# Patient Record
Sex: Male | Born: 1965 | Race: White | Hispanic: No | Smoking: Current every day smoker
Health system: Southern US, Community
[De-identification: ages and names within clinical notes are randomized; demographics above are authoritative.]

## PROBLEM LIST (undated history)

## (undated) DIAGNOSIS — R7303 Prediabetes: Secondary | ICD-10-CM

## (undated) DIAGNOSIS — F419 Anxiety disorder, unspecified: Secondary | ICD-10-CM

## (undated) DIAGNOSIS — I10 Essential (primary) hypertension: Secondary | ICD-10-CM

## (undated) HISTORY — PX: NASAL SINUS SURGERY: SHX719

## (undated) HISTORY — PX: HIATAL HERNIA REPAIR: SHX195

## (undated) HISTORY — PX: CERVICAL SPINE SURGERY: SHX589

---

## 1999-11-26 ENCOUNTER — Encounter: Payer: Self-pay | Admitting: Family Medicine

## 1999-11-26 ENCOUNTER — Encounter: Admission: RE | Admit: 1999-11-26 | Discharge: 1999-11-26 | Payer: Self-pay | Admitting: Family Medicine

## 2001-02-05 ENCOUNTER — Encounter: Admission: RE | Admit: 2001-02-05 | Discharge: 2001-02-05 | Payer: Self-pay | Admitting: Family Medicine

## 2001-02-06 ENCOUNTER — Encounter: Admission: RE | Admit: 2001-02-06 | Discharge: 2001-02-06 | Payer: Self-pay | Admitting: Family Medicine

## 2001-03-31 ENCOUNTER — Encounter: Admission: RE | Admit: 2001-03-31 | Discharge: 2001-05-13 | Payer: Self-pay | Admitting: Neurosurgery

## 2003-02-07 ENCOUNTER — Ambulatory Visit (HOSPITAL_BASED_OUTPATIENT_CLINIC_OR_DEPARTMENT_OTHER): Admission: RE | Admit: 2003-02-07 | Discharge: 2003-02-07 | Payer: Self-pay | Admitting: Otolaryngology

## 2003-12-12 ENCOUNTER — Ambulatory Visit (HOSPITAL_COMMUNITY): Admission: RE | Admit: 2003-12-12 | Discharge: 2003-12-13 | Payer: Self-pay | Admitting: Neurosurgery

## 2005-12-11 ENCOUNTER — Emergency Department (HOSPITAL_COMMUNITY): Admission: EM | Admit: 2005-12-11 | Discharge: 2005-12-11 | Payer: Self-pay | Admitting: Emergency Medicine

## 2017-07-01 ENCOUNTER — Encounter (INDEPENDENT_AMBULATORY_CARE_PROVIDER_SITE_OTHER): Payer: Self-pay | Admitting: Orthopaedic Surgery

## 2017-07-01 ENCOUNTER — Ambulatory Visit (INDEPENDENT_AMBULATORY_CARE_PROVIDER_SITE_OTHER): Payer: BLUE CROSS/BLUE SHIELD

## 2017-07-01 ENCOUNTER — Ambulatory Visit (INDEPENDENT_AMBULATORY_CARE_PROVIDER_SITE_OTHER): Payer: BLUE CROSS/BLUE SHIELD | Admitting: Orthopaedic Surgery

## 2017-07-01 VITALS — BP 144/81 | HR 71 | Ht 69.0 in | Wt 195.0 lb

## 2017-07-01 DIAGNOSIS — M542 Cervicalgia: Secondary | ICD-10-CM

## 2017-07-01 DIAGNOSIS — M4722 Other spondylosis with radiculopathy, cervical region: Secondary | ICD-10-CM | POA: Diagnosis not present

## 2017-07-01 MED ORDER — CYCLOBENZAPRINE HCL 10 MG PO TABS: 10.0000 mg | ORAL_TABLET | Freq: Every day | ORAL | 0 refills | Status: DC

## 2017-07-01 NOTE — Addendum Note (Signed)
Addended by: Rogers Seeds on: 07/01/2017 09:26 AM   Modules accepted: Orders

## 2017-07-01 NOTE — Progress Notes (Signed)
Office Visit Note   Patient: Ethan Green           Date of Birth: [birth year]           MRN: [id number] Visit Date: 07/01/2017              Requested by: No referring provider defined for this encounter. PCP: Barbie Banner, MD   Assessment & Plan: Visit Diagnoses:  1. Neck pain   2. Other spondylosis with radiculopathy, cervical region     Plan: Due to patient's significant pain at night and recommend proceeding with an MRI scan cervical spine. Flexeril prescribed he can take at night.  Follow-Up Instructions: No Follow-up on file.   Orders:  Orders Placed This Encounter  Procedures  . XR Cervical Spine 2 or 3 views   No orders of the defined types were placed in this encounter.     Procedures: No procedures performed   Clinical Data: No additional findings.   Subjective: Chief Complaint  Patient presents with  . Left Shoulder - Pain    HPI 51 year old male with the left shoulder pain and aching. He states his leg off and work he works doing Psychologist, prison and probation services work. He states that tonight he has increased pain wakes him up at night he has trouble getting to sleep. It's gotten worse since February. Onset of symptoms was a year and a half ago. Previous C5-6 cervical fusion 2004 which did well. Patient denies any bowel or bladder symptoms he doesn't have any problems with lower extremity weakness he can walk stairs. No history of cancer. He's used ibuprofen intermittently. He states at night when he tries to rest these holding still he states the pain at times is severe.  Review of Systems 14 point review of systems positive for stress reflux anxiety. Diabetes borderline not on any medications currently. History of hypertension history were remote pneumonia. C5-6 anterior cervical fusion with plating 2004. He is followed by Dr. Benedetto Goad. Systems are negative other than as mentioned in history of present illness.   Objective: Vital Signs: BP (!) 144/81    Pulse 71   Ht  (1.753 m)   Wt 195 lb (88.5 kg)   BMI 28.80 kg/m   Physical Exam  Constitutional: He is oriented to person, place, and time. He appears well-developed and well-nourished.  HENT:  Head: Normocephalic and atraumatic.  Eyes: Pupils are equal, round, and reactive to light. EOM are normal.  Neck: No tracheal deviation present. No thyromegaly present.  Cardiovascular: Normal rate.   Pulmonary/Chest: Effort normal. He has no wheezes.  Abdominal: Soft. Bowel sounds are normal.  Neurological: He is alert and oriented to person, place, and time.  Skin: Skin is warm and dry. Capillary refill takes less than 2 seconds.  Psychiatric: He has a normal mood and affect. His behavior is normal. Judgment and thought content normal.    Ortho Exam upper and lower extremity reflexes are 1+ and symmetrical. Negative drop arm test negative impingement left shoulder. His brachial plexus tenderness on the left. The pain then radiates into the left shoulder and sometimes in the axilla. Biceps triceps brachioradialis are strong no atrophy. Full elbow range of motion. Patient has full active range of motion left shoulder. Before meals joint is nontender along the biceps is normal. No brachial plexus tenderness on the right side positive Spurling on the left name for me. No lower extremity hyperreflexia and no clonus.  Specialty Comments:  No  specialty comments available.  Imaging: Xr Cervical Spine 2 Or 3 Views  Result Date: 07/01/2017 AP and lateral C-spine x-rays are obtained. This shows a cervical plate solid fusion C5-6. Greater than 50% disc space narrowing and spurring at C6-7 below the fusion. Levels above show normal disc space no anterolisthesis. Impression: Solid cervical fusion C5-6 with spondylosis C6-7 below the fusion.    PMFS History: There are no active problems to display for this patient.  No past medical history on file.  No family history on file.  No past surgical  history on file. Social History   Occupational History  . Not on file.   Social History Main Topics  . Smoking status: Current Every Day Smoker  . Smokeless tobacco: Never Used  . Alcohol use No  . Drug use: No  . Sexual activity: Not on file

## 2017-07-03 ENCOUNTER — Telehealth (INDEPENDENT_AMBULATORY_CARE_PROVIDER_SITE_OTHER): Payer: Self-pay | Admitting: Orthopaedic Surgery

## 2017-07-03 NOTE — Telephone Encounter (Signed)
Patient called having some questions about his MRI. CB # [phone]

## 2017-07-03 NOTE — Telephone Encounter (Signed)
I left voicemail returning patient's call.

## 2017-07-03 NOTE — Telephone Encounter (Signed)
fyi  Patient called and states due to insurance change, he would like to postpone his MRI until the first week of December unless something emergently changes.

## 2017-07-18 ENCOUNTER — Ambulatory Visit (INDEPENDENT_AMBULATORY_CARE_PROVIDER_SITE_OTHER): Payer: BLUE CROSS/BLUE SHIELD | Admitting: Orthopaedic Surgery

## 2017-09-02 ENCOUNTER — Ambulatory Visit (INDEPENDENT_AMBULATORY_CARE_PROVIDER_SITE_OTHER): Payer: BLUE CROSS/BLUE SHIELD | Admitting: Orthopaedic Surgery

## 2017-09-16 ENCOUNTER — Other Ambulatory Visit: Payer: Self-pay

## 2017-09-19 ENCOUNTER — Ambulatory Visit (INDEPENDENT_AMBULATORY_CARE_PROVIDER_SITE_OTHER): Payer: BLUE CROSS/BLUE SHIELD | Admitting: Orthopaedic Surgery

## 2017-10-02 ENCOUNTER — Ambulatory Visit
Admission: RE | Admit: 2017-10-02 | Discharge: 2017-10-02 | Disposition: A | Discharge status: Home / Self Care | Payer: BLUE CROSS/BLUE SHIELD | Source: Ambulatory Visit | Attending: Orthopaedic Surgery | Admitting: Orthopaedic Surgery

## 2017-10-02 DIAGNOSIS — M4722 Other spondylosis with radiculopathy, cervical region: Secondary | ICD-10-CM

## 2017-10-21 ENCOUNTER — Ambulatory Visit (INDEPENDENT_AMBULATORY_CARE_PROVIDER_SITE_OTHER): Payer: BLUE CROSS/BLUE SHIELD | Admitting: Orthopaedic Surgery

## 2017-10-21 ENCOUNTER — Encounter (INDEPENDENT_AMBULATORY_CARE_PROVIDER_SITE_OTHER): Payer: Self-pay | Admitting: Orthopaedic Surgery

## 2017-10-21 ENCOUNTER — Other Ambulatory Visit (INDEPENDENT_AMBULATORY_CARE_PROVIDER_SITE_OTHER): Payer: Self-pay | Admitting: Orthopaedic Surgery

## 2017-10-21 VITALS — BP 149/80 | HR 78 | Ht 70.0 in | Wt 195.0 lb

## 2017-10-21 DIAGNOSIS — M4712 Other spondylosis with myelopathy, cervical region: Secondary | ICD-10-CM

## 2017-10-21 DIAGNOSIS — M5020 Other cervical disc displacement, unspecified cervical region: Secondary | ICD-10-CM | POA: Diagnosis not present

## 2017-10-21 DIAGNOSIS — M4722 Other spondylosis with radiculopathy, cervical region: Secondary | ICD-10-CM

## 2017-10-21 NOTE — H&P (View-Only) (Signed)
Office Visit Note   Patient: Ethan Green           Date of Birth: 14-Jul-1966           MRN: [id number] Visit Date: 10/21/2017              Requested by: Barbie Banner, MD [address] [place], [place] [address] PCP: Barbie Banner, MD   Assessment & Plan: Visit Diagnoses:  1. Other spondylosis with radiculopathy, cervical region   2. Protrusion of cervical intervertebral disc     Plan: C6-7 combination of soft and hard disc below solid C5-6 fusion.  He has severe left foraminal stenosis at C6-7 and moderate to severe on the right.  Small bulge at C4-5 without compression.  We discussed options with him he has had persistent increasing symptoms and would like to proceed with operative intervention when his workload is not is busy and he states he is not sure he can continue handling the pain which is gradually increasing month-to-month.  Plan to be plate removal at C5-6 and then C6-7 anterior cervical discectomy and fusion with allograft and plate.  He would stay in the hospital overnight and be in a soft cervical collar for 6 weeks.  Procedure discussed.  MRI scan was reviewed with him images and I gave him a copy of the report.  Questions were elicited and answered.  Decision for surgery made and we discussed problems with dysphasia or dysphonia risk for possible pseudoarthrosis potential for posterior fusion of he had a symptomatic pseudoarthrosis anteriorly.  Risks of infection discussed as well as diabetic control discussed.  He requests we proceed.  Follow-Up Instructions: No Follow-up on file.   Orders:  No orders of the defined types were placed in this encounter.  No orders of the defined types were placed in this encounter.     Procedures: No procedures performed   Clinical Data: No additional findings.   Subjective: Chief Complaint  Patient presents with  . Neck - Pain, Follow-up    HPI 52 year old male returns with ongoing problems with neck pain and  left greater than right shoulder pain with progressive symptoms over the last 4 months.  Pain wakes him up at night makes it difficult for him to go to sleep.  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  He feels better if he puts his arm up over his head at times.  He has some days when he has less symptoms.  He is used anti-inflammatories as well as muscle relaxants without relief.  Currently he is using Flexeril and ibuprofen.  At times he rates his pain is severe other times is moderate.  Review of Systems positive for past history of pneumonia.  He is had hypertension and takes medication.  Previous C5-6 cervical fusion 2005.  Positive for borderline diabetes.  Negative cardiac history other than hypertension.   Objective: Vital Signs: BP (!) 149/80   Pulse 78   Ht 5\' 10"  (1.778 m)   Wt 195 lb (88.5 kg)   BMI 27.98 kg/m   Physical Exam  Constitutional: He is oriented to person, place, and time. He appears well-developed and well-nourished.  HENT:  Head: Normocephalic and atraumatic.  Eyes: EOM are normal. Pupils are equal, round, and reactive to light.  Neck: No tracheal deviation present. No thyromegaly present.  Cardiovascular: Normal rate.  Pulmonary/Chest: Effort normal. He has no wheezes.  Abdominal: Soft. Bowel sounds  are normal.  Neurological: He is alert and oriented to person, place, and time.  Skin: Skin is warm and dry. Capillary refill takes less than 2 seconds.  Psychiatric: He has a normal mood and affect. His behavior is normal. Judgment and thought content normal.    Ortho Exam patient has brachial plexus tenderness moderate to severe on the left mild on the right.  Positive Spurling on the left.  Negative drop arm test no instability of the shoulders.  Acromioclavicular joints are normal.  Wrist flexion extension biceps triceps are strong.  Pain with tilting rotation of the left reproduces her symptoms.  Specialty Comments:  No specialty  comments available.  Imaging: CLINICAL DATA:  Neck, left shoulder and left-side pain for 2 years, worsening. History of prior cervical surgery.  EXAM: MRI CERVICAL SPINE WITHOUT CONTRAST  TECHNIQUE: Multiplanar, multisequence MR imaging of the cervical spine was performed. No intravenous contrast was administered.  COMPARISON:  Two views of the cervical spine 07/01/2017 from [facility].  FINDINGS: Alignment: Maintained.  Vertebrae: No fracture or worrisome lesion. Solid C5-6 ACDF is identified.  Cord: Cervical cord signal is normal.  Posterior Fossa, vertebral arteries, paraspinal tissues: Patchy T2 hyperintensity in the pons is most consistent with chronic microvascular ischemic change.  Disc levels:  C2-3: Facet degenerative disease on the left causes mild left foraminal narrowing. Otherwise negative.  C3-4: Mild facet degenerative change bilaterally. Minimal disc bulge. The central canal and foramina are open.  C4-5: Shallow broad-based central protrusion narrows but does not efface the ventral thecal sac. The central canal and foramina appear open.  C5-6: Status post discectomy and fusion. The central canal and foramina are widely patent.  C6-7: The patient has a disc osteophyte complex eccentric to the left and left much worse than right uncovertebral disease. The ventral thecal sac is effaced. Severe left and moderate to moderately severe right foraminal narrowing is present.  C7-T1:  Negative.  T1-2: Disc bulge without central canal stenosis. Mild to moderate foraminal narrowing appears worse on the right.  IMPRESSION: Spondylosis appearing worst at C6-7 where a disc bulge effaces the ventral thecal sac and uncovertebral disease causes severe left and moderate to moderately severe right foraminal narrowing.  Shallow broad-based central protrusion at C4-5 effaces the ventral thecal sac but the central canal and foramina are  open.  Mild left foraminal narrowing C2-3 due to facet arthropathy.  Solid C5-6 fusion. The central canal and foramina are widely patent.   Electronically Signed   By: Drusilla Kannerhomas  Dalessio M.D.   On: 10/02/2017 15:55    PMFS History: There are no active problems to display for this patient.  History reviewed. No pertinent past medical history.  History reviewed. No pertinent family history.  History reviewed. No pertinent surgical history. Social History   Occupational History  . Not on file  Tobacco Use  . Smoking status: Current Every Day Smoker  . Smokeless tobacco: Never Used  Substance and Sexual Activity  . Alcohol use: No  . Drug use: No  . Sexual activity: Not on file

## 2017-10-21 NOTE — Progress Notes (Signed)
Office Visit Note   Patient: Ethan Green           Date of Birth: 14-Jul-1966           MRN: [id number] Visit Date: 10/21/2017              Requested by: Barbie Banner, MD [address] [place], [place] [address] PCP: Barbie Banner, MD   Assessment & Plan: Visit Diagnoses:  1. Other spondylosis with radiculopathy, cervical region   2. Protrusion of cervical intervertebral disc     Plan: C6-7 combination of soft and hard disc below solid C5-6 fusion.  He has severe left foraminal stenosis at C6-7 and moderate to severe on the right.  Small bulge at C4-5 without compression.  We discussed options with him he has had persistent increasing symptoms and would like to proceed with operative intervention when his workload is not is busy and he states he is not sure he can continue handling the pain which is gradually increasing month-to-month.  Plan to be plate removal at C5-6 and then C6-7 anterior cervical discectomy and fusion with allograft and plate.  He would stay in the hospital overnight and be in a soft cervical collar for 6 weeks.  Procedure discussed.  MRI scan was reviewed with him images and I gave him a copy of the report.  Questions were elicited and answered.  Decision for surgery made and we discussed problems with dysphasia or dysphonia risk for possible pseudoarthrosis potential for posterior fusion of he had a symptomatic pseudoarthrosis anteriorly.  Risks of infection discussed as well as diabetic control discussed.  He requests we proceed.  Follow-Up Instructions: No Follow-up on file.   Orders:  No orders of the defined types were placed in this encounter.  No orders of the defined types were placed in this encounter.     Procedures: No procedures performed   Clinical Data: No additional findings.   Subjective: Chief Complaint  Patient presents with  . Neck - Pain, Follow-up    HPI 52 year old male returns with ongoing problems with neck pain and  left greater than right shoulder pain with progressive symptoms over the last 4 months.  Pain wakes him up at night makes it difficult for him to go to sleep.  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  He feels better if he puts his arm up over his head at times.  He has some days when he has less symptoms.  He is used anti-inflammatories as well as muscle relaxants without relief.  Currently he is using Flexeril and ibuprofen.  At times he rates his pain is severe other times is moderate.  Review of Systems positive for past history of pneumonia.  He is had hypertension and takes medication.  Previous C5-6 cervical fusion 2005.  Positive for borderline diabetes.  Negative cardiac history other than hypertension.   Objective: Vital Signs: BP (!) 149/80   Pulse 78   Ht 5\' 10"  (1.778 m)   Wt 195 lb (88.5 kg)   BMI 27.98 kg/m   Physical Exam  Constitutional: He is oriented to person, place, and time. He appears well-developed and well-nourished.  HENT:  Head: Normocephalic and atraumatic.  Eyes: EOM are normal. Pupils are equal, round, and reactive to light.  Neck: No tracheal deviation present. No thyromegaly present.  Cardiovascular: Normal rate.  Pulmonary/Chest: Effort normal. He has no wheezes.  Abdominal: Soft. Bowel sounds  are normal.  Neurological: He is alert and oriented to person, place, and time.  Skin: Skin is warm and dry. Capillary refill takes less than 2 seconds.  Psychiatric: He has a normal mood and affect. His behavior is normal. Judgment and thought content normal.    Ortho Exam patient has brachial plexus tenderness moderate to severe on the left mild on the right.  Positive Spurling on the left.  Negative drop arm test no instability of the shoulders.  Acromioclavicular joints are normal.  Wrist flexion extension biceps triceps are strong.  Pain with tilting rotation of the left reproduces her symptoms.  Specialty Comments:  No specialty  comments available.  Imaging: CLINICAL DATA:  Neck, left shoulder and left-side pain for 2 years, worsening. History of prior cervical surgery.  EXAM: MRI CERVICAL SPINE WITHOUT CONTRAST  TECHNIQUE: Multiplanar, multisequence MR imaging of the cervical spine was performed. No intravenous contrast was administered.  COMPARISON:  Two views of the cervical spine 07/01/2017 from [facility].  FINDINGS: Alignment: Maintained.  Vertebrae: No fracture or worrisome lesion. Solid C5-6 ACDF is identified.  Cord: Cervical cord signal is normal.  Posterior Fossa, vertebral arteries, paraspinal tissues: Patchy T2 hyperintensity in the pons is most consistent with chronic microvascular ischemic change.  Disc levels:  C2-3: Facet degenerative disease on the left causes mild left foraminal narrowing. Otherwise negative.  C3-4: Mild facet degenerative change bilaterally. Minimal disc bulge. The central canal and foramina are open.  C4-5: Shallow broad-based central protrusion narrows but does not efface the ventral thecal sac. The central canal and foramina appear open.  C5-6: Status post discectomy and fusion. The central canal and foramina are widely patent.  C6-7: The patient has a disc osteophyte complex eccentric to the left and left much worse than right uncovertebral disease. The ventral thecal sac is effaced. Severe left and moderate to moderately severe right foraminal narrowing is present.  C7-T1:  Negative.  T1-2: Disc bulge without central canal stenosis. Mild to moderate foraminal narrowing appears worse on the right.  IMPRESSION: Spondylosis appearing worst at C6-7 where a disc bulge effaces the ventral thecal sac and uncovertebral disease causes severe left and moderate to moderately severe right foraminal narrowing.  Shallow broad-based central protrusion at C4-5 effaces the ventral thecal sac but the central canal and foramina are  open.  Mild left foraminal narrowing C2-3 due to facet arthropathy.  Solid C5-6 fusion. The central canal and foramina are widely patent.   Electronically Signed   By: Drusilla Kannerhomas  Dalessio M.D.   On: 10/02/2017 15:55    PMFS History: There are no active problems to display for this patient.  History reviewed. No pertinent past medical history.  History reviewed. No pertinent family history.  History reviewed. No pertinent surgical history. Social History   Occupational History  . Not on file  Tobacco Use  . Smoking status: Current Every Day Smoker  . Smokeless tobacco: Never Used  Substance and Sexual Activity  . Alcohol use: No  . Drug use: No  . Sexual activity: Not on file

## 2017-10-24 NOTE — Pre-Procedure Instructions (Signed)
Ethan DownerRandy D Green  10/24/2017      [id number] - [place], [place] - [address] [place] [id number] Phone: [phone] Fax: [phone]    Your procedure is scheduled on Wednesday January 16.  [facility] at 10:30 A.M.  Call this number if you have problems the morning of surgery:  [phone]   Remember:  Do not eat food or drink liquids after midnight.  Take these medicines the morning of surgery with A SIP OF WATER: paroxetine (Paxil)  7 days prior to surgery STOP taking any Aspirin(unless otherwise instructed by your surgeon), Aleve, Naproxen, Ibuprofen, Motrin, Advil, Goody's, BC's, all herbal medications, fish oil, and all vitamins    Do not wear jewelry, make-up or nail polish.  Do not wear lotions, powders, or perfumes, or deodorant.  Do not shave 48 hours prior to surgery.  Men may shave face and neck.  Do not bring valuables to the hospital.  [facility] is not responsible for any belongings or valuables.  Contacts, dentures or bridgework may not be worn into surgery.  Leave your suitcase in the car.  After surgery it may be brought to your room.  For patients admitted to the hospital, discharge time will be determined by your treatment team.  Patients discharged the day of surgery will not be allowed to drive home.    Special instructions:    [facility]- Preparing For Surgery  Before surgery, you can play an important role. Because skin is not sterile, your skin needs to be as free of germs as possible. You can reduce the number of germs on your skin by washing with CHG (chlorahexidine gluconate) Soap before surgery.  CHG is an antiseptic cleaner which kills germs and bonds with the skin to continue killing germs even after washing.  Please do not use if you have an allergy to CHG or antibacterial soaps. If your skin becomes reddened/irritated stop using the CHG.  Do not shave (including legs  and underarms) for at least 48 hours prior to first CHG shower. It is OK to shave your face.  Please follow these instructions carefully.   1. Shower the NIGHT BEFORE SURGERY and the MORNING OF SURGERY with CHG.   2. If you chose to wash your hair, wash your hair first as usual with your normal shampoo.  3. After you shampoo, rinse your hair and body thoroughly to remove the shampoo.  4. Use CHG as you would any other liquid soap. You can apply CHG directly to the skin and wash gently with a scrungie or a clean washcloth.   5. Apply the CHG Soap to your body ONLY FROM THE NECK DOWN.  Do not use on open wounds or open sores. Avoid contact with your eyes, ears, mouth and genitals (private parts). Wash Face and genitals (private parts)  with your normal soap.  6. Wash thoroughly, paying special attention to the area where your surgery will be performed.  7. Thoroughly rinse your body with warm water from the neck down.  8. DO NOT shower/wash with your normal soap after using and rinsing off the CHG Soap.  9. Pat yourself dry with a CLEAN TOWEL.  10. Wear CLEAN PAJAMAS to bed the night before surgery, wear comfortable clothes the morning of surgery  11. Place CLEAN SHEETS on your bed the night of your first shower and DO NOT SLEEP WITH PETS.    Day of Surgery:  Do not apply any deodorants/lotions. Please wear clean clothes to the hospital/surgery center.      Please read over the following fact sheets that you were given. Coughing and Deep Breathing, MRSA Information and Surgical Site Infection Prevention

## 2017-10-27 ENCOUNTER — Ambulatory Visit (HOSPITAL_COMMUNITY)
Admission: RE | Admit: 2017-10-27 | Discharge: 2017-10-27 | Disposition: A | Discharge status: Home / Self Care | Payer: BLUE CROSS/BLUE SHIELD | Source: Ambulatory Visit | Attending: Orthopaedic Surgery | Admitting: Orthopaedic Surgery

## 2017-10-27 ENCOUNTER — Encounter (HOSPITAL_COMMUNITY): Payer: Self-pay

## 2017-10-27 ENCOUNTER — Other Ambulatory Visit: Payer: Self-pay

## 2017-10-27 ENCOUNTER — Encounter (HOSPITAL_COMMUNITY)
Admission: RE | Admit: 2017-10-27 | Discharge: 2017-10-27 | Disposition: A | Discharge status: Home / Self Care | Payer: BLUE CROSS/BLUE SHIELD | Source: Ambulatory Visit | Attending: Orthopaedic Surgery | Admitting: Orthopaedic Surgery

## 2017-10-27 DIAGNOSIS — M47812 Spondylosis without myelopathy or radiculopathy, cervical region: Secondary | ICD-10-CM | POA: Insufficient documentation

## 2017-10-27 DIAGNOSIS — Z01812 Encounter for preprocedural laboratory examination: Secondary | ICD-10-CM | POA: Diagnosis present

## 2017-10-27 DIAGNOSIS — Z01810 Encounter for preprocedural cardiovascular examination: Secondary | ICD-10-CM | POA: Diagnosis not present

## 2017-10-27 DIAGNOSIS — F17200 Nicotine dependence, unspecified, uncomplicated: Secondary | ICD-10-CM | POA: Diagnosis not present

## 2017-10-27 HISTORY — DX: Prediabetes: R73.03

## 2017-10-27 HISTORY — DX: Essential (primary) hypertension: I10

## 2017-10-27 HISTORY — DX: Anxiety disorder, unspecified: F41.9

## 2017-10-27 LAB — URINALYSIS, ROUTINE W REFLEX MICROSCOPIC
BACTERIA UA: NONE SEEN
BILIRUBIN URINE: NEGATIVE
Glucose, UA: 50 mg/dL — AB
KETONES UR: NEGATIVE mg/dL
LEUKOCYTES UA: NEGATIVE
NITRITE: NEGATIVE
PROTEIN: NEGATIVE mg/dL
Specific Gravity, Urine: 1.017 (ref 1.005–1.030)
pH: 5 (ref 5.0–8.0)

## 2017-10-27 LAB — CBC
HCT: 46.6 % (ref 39.0–52.0)
Hemoglobin: 16.4 g/dL (ref 13.0–17.0)
MCH: 32 pg (ref 26.0–34.0)
MCHC: 35.2 g/dL (ref 30.0–36.0)
MCV: 91 fL (ref 78.0–100.0)
PLATELETS: 213 10*3/uL (ref 150–400)
RBC: 5.12 MIL/uL (ref 4.22–5.81)
RDW: 13.8 % (ref 11.5–15.5)
WBC: 9.4 10*3/uL (ref 4.0–10.5)

## 2017-10-27 LAB — COMPREHENSIVE METABOLIC PANEL
ALT: 42 U/L (ref 17–63)
AST: 45 U/L — ABNORMAL HIGH (ref 15–41)
Albumin: 4.2 g/dL (ref 3.5–5.0)
Alkaline Phosphatase: 100 U/L (ref 38–126)
Anion gap: 10 (ref 5–15)
BUN: 8 mg/dL (ref 6–20)
CHLORIDE: 100 mmol/L — AB (ref 101–111)
CO2: 26 mmol/L (ref 22–32)
CREATININE: 0.97 mg/dL (ref 0.61–1.24)
Calcium: 9.6 mg/dL (ref 8.9–10.3)
GFR calc non Af Amer: >60 mL/min (ref >60)
Glucose, Bld: 139 mg/dL — ABNORMAL HIGH (ref 65–99)
Potassium: 3.9 mmol/L (ref 3.5–5.1)
Sodium: 136 mmol/L (ref 135–145)
Total Bilirubin: 0.7 mg/dL (ref 0.3–1.2)
Total Protein: 7.4 g/dL (ref 6.5–8.1)

## 2017-10-27 LAB — HEMOGLOBIN A1C
HEMOGLOBIN A1C: 7.1 % — AB (ref 4.8–5.6)
Mean Plasma Glucose: 157.07 mg/dL

## 2017-10-27 LAB — GLUCOSE, CAPILLARY: Glucose-Capillary: 141 mg/dL — ABNORMAL HIGH (ref 65–99)

## 2017-10-27 NOTE — Progress Notes (Signed)
   10/27/17 1533  OBSTRUCTIVE SLEEP APNEA  Have you ever been diagnosed with sleep apnea through a sleep study? No  Do you snore loudly (loud enough to be heard through closed doors)?  1  Do you often feel tired, fatigued, or sleepy during the daytime (such as falling asleep during driving or talking to someone)? 0  Has anyone observed you stop breathing during your sleep? 1  Do you have, or are you being treated for high blood pressure? 1  BMI more than 35 kg/m2? 0  Age > 50 (1-yes) 1  Neck circumference greater than:Male 16 inches or larger, Male 17inches or larger? 0  Male Gender (Yes=1) 1  Obstructive Sleep Apnea Score 5  Score 5 or greater  Results sent to PCP

## 2017-10-27 NOTE — Progress Notes (Addendum)
PCP - Benedetto GoadFred Wilson Cardiologist - denies cardiac workup  Chest x-ray - 10/27/2017  EKG - 10/27/2017   Pt borderline diabetic. A1c drawn today. Fasting CBG today 141. Pt states he has not eaten today   Patient denies shortness of breath, fever, cough and chest pain at PAT appointment   Patient verbalized understanding of instructions that were given to them at the PAT appointment. Patient was also instructed that they will need to review over the PAT instructions again at home before surgery.

## 2017-10-28 LAB — SURGICAL PCR SCREEN
MRSA, PCR: NEGATIVE
STAPHYLOCOCCUS AUREUS: POSITIVE — AB

## 2017-10-28 MED ORDER — CLINDAMYCIN PHOSPHATE 900 MG/50ML IV SOLN
900.0000 mg | INTRAVENOUS | Status: AC
  Administered 2017-10-29: 900 mg via INTRAVENOUS
  Filled 2017-10-28: qty 50

## 2017-10-29 ENCOUNTER — Observation Stay (HOSPITAL_COMMUNITY)
Admission: RE | Admit: 2017-10-29 | Discharge: 2017-10-30 | Disposition: A | Discharge status: Home / Self Care | Payer: BLUE CROSS/BLUE SHIELD | Source: Ambulatory Visit | Attending: Orthopaedic Surgery | Admitting: Orthopaedic Surgery

## 2017-10-29 ENCOUNTER — Ambulatory Visit (HOSPITAL_COMMUNITY): Payer: BLUE CROSS/BLUE SHIELD | Admitting: Anesthesiology

## 2017-10-29 ENCOUNTER — Encounter (HOSPITAL_COMMUNITY): Payer: Self-pay | Admitting: *Deleted

## 2017-10-29 ENCOUNTER — Observation Stay (HOSPITAL_COMMUNITY): Payer: BLUE CROSS/BLUE SHIELD

## 2017-10-29 ENCOUNTER — Encounter (HOSPITAL_COMMUNITY)
Admission: RE | Disposition: A | Discharge status: Home / Self Care | Payer: Self-pay | Source: Ambulatory Visit | Attending: Orthopaedic Surgery

## 2017-10-29 DIAGNOSIS — Z419 Encounter for procedure for purposes other than remedying health state, unspecified: Secondary | ICD-10-CM

## 2017-10-29 DIAGNOSIS — Z79899 Other long term (current) drug therapy: Secondary | ICD-10-CM | POA: Insufficient documentation

## 2017-10-29 DIAGNOSIS — I10 Essential (primary) hypertension: Secondary | ICD-10-CM | POA: Diagnosis not present

## 2017-10-29 DIAGNOSIS — F17200 Nicotine dependence, unspecified, uncomplicated: Secondary | ICD-10-CM | POA: Insufficient documentation

## 2017-10-29 DIAGNOSIS — R7303 Prediabetes: Secondary | ICD-10-CM | POA: Insufficient documentation

## 2017-10-29 DIAGNOSIS — M50121 Cervical disc disorder at C4-C5 level with radiculopathy: Secondary | ICD-10-CM | POA: Diagnosis not present

## 2017-10-29 DIAGNOSIS — Z4589 Encounter for adjustment and management of other implanted devices: Secondary | ICD-10-CM | POA: Insufficient documentation

## 2017-10-29 DIAGNOSIS — M4712 Other spondylosis with myelopathy, cervical region: Secondary | ICD-10-CM

## 2017-10-29 DIAGNOSIS — M4802 Spinal stenosis, cervical region: Secondary | ICD-10-CM | POA: Diagnosis present

## 2017-10-29 DIAGNOSIS — F419 Anxiety disorder, unspecified: Secondary | ICD-10-CM | POA: Insufficient documentation

## 2017-10-29 DIAGNOSIS — M4722 Other spondylosis with radiculopathy, cervical region: Secondary | ICD-10-CM | POA: Insufficient documentation

## 2017-10-29 DIAGNOSIS — Z888 Allergy status to other drugs, medicaments and biological substances status: Secondary | ICD-10-CM | POA: Insufficient documentation

## 2017-10-29 DIAGNOSIS — Z880 Allergy status to penicillin: Secondary | ICD-10-CM | POA: Diagnosis not present

## 2017-10-29 DIAGNOSIS — M47892 Other spondylosis, cervical region: Secondary | ICD-10-CM | POA: Diagnosis not present

## 2017-10-29 HISTORY — PX: ANTERIOR CERVICAL DECOMP/DISCECTOMY FUSION: SHX1161

## 2017-10-29 SURGERY — ANTERIOR CERVICAL DECOMPRESSION/DISCECTOMY FUSION 1 LEVEL/HARDWARE REMOVAL
Anesthesia: General

## 2017-10-29 MED ORDER — LIDOCAINE 2% (20 MG/ML) 5 ML SYRINGE
INTRAMUSCULAR | Status: AC
  Filled 2017-10-29: qty 5

## 2017-10-29 MED ORDER — PHENOL 1.4 % MT LIQD: 1.0000 | OROMUCOSAL | Status: DC | PRN

## 2017-10-29 MED ORDER — ONDANSETRON HCL 4 MG/2ML IJ SOLN: 4.0000 mg | Freq: Four times a day (QID) | INTRAMUSCULAR | Status: DC | PRN

## 2017-10-29 MED ORDER — POLYETHYLENE GLYCOL 3350 17 G PO PACK: 17.0000 g | PACK | Freq: Every day | ORAL | Status: DC

## 2017-10-29 MED ORDER — DEXAMETHASONE SODIUM PHOSPHATE 10 MG/ML IJ SOLN
INTRAMUSCULAR | Status: DC | PRN
  Administered 2017-10-29: 5 mg via INTRAVENOUS

## 2017-10-29 MED ORDER — FENTANYL CITRATE (PF) 250 MCG/5ML IJ SOLN
INTRAMUSCULAR | Status: AC
  Filled 2017-10-29: qty 5

## 2017-10-29 MED ORDER — ACETAMINOPHEN 500 MG PO TABS
1000.0000 mg | ORAL_TABLET | Freq: Once | ORAL | Status: AC
  Administered 2017-10-29: 1000 mg via ORAL

## 2017-10-29 MED ORDER — OXYCODONE HCL 5 MG PO TABS
5.0000 mg | ORAL_TABLET | ORAL | Status: DC | PRN
  Administered 2017-10-29 – 2017-10-30 (×5): 5 mg via ORAL
  Filled 2017-10-29 (×5): qty 1

## 2017-10-29 MED ORDER — NEOSTIGMINE METHYLSULFATE 10 MG/10ML IV SOLN
INTRAVENOUS | Status: DC | PRN
  Administered 2017-10-29: 4 mg via INTRAVENOUS

## 2017-10-29 MED ORDER — SODIUM CHLORIDE 0.9 % IV SOLN: INTRAVENOUS | Status: DC

## 2017-10-29 MED ORDER — GLYCOPYRROLATE 0.2 MG/ML IJ SOLN
INTRAMUSCULAR | Status: DC | PRN
  Administered 2017-10-29: 0.6 mg via INTRAVENOUS

## 2017-10-29 MED ORDER — GABAPENTIN 300 MG PO CAPS
ORAL_CAPSULE | ORAL | Status: AC
  Filled 2017-10-29: qty 1

## 2017-10-29 MED ORDER — LACTATED RINGERS IV SOLN
INTRAVENOUS | Status: DC
  Administered 2017-10-29 (×2): via INTRAVENOUS

## 2017-10-29 MED ORDER — MIDAZOLAM HCL 5 MG/5ML IJ SOLN
INTRAMUSCULAR | Status: DC | PRN
  Administered 2017-10-29: 2 mg via INTRAVENOUS

## 2017-10-29 MED ORDER — ONDANSETRON HCL 4 MG/2ML IJ SOLN
INTRAMUSCULAR | Status: DC | PRN
  Administered 2017-10-29: 4 mg via INTRAVENOUS

## 2017-10-29 MED ORDER — PAROXETINE HCL 10 MG PO TABS
10.0000 mg | ORAL_TABLET | Freq: Every day | ORAL | Status: DC
  Administered 2017-10-30: 10 mg via ORAL
  Filled 2017-10-29 (×2): qty 1

## 2017-10-29 MED ORDER — ONDANSETRON HCL 4 MG/2ML IJ SOLN
INTRAMUSCULAR | Status: AC
  Filled 2017-10-29: qty 2

## 2017-10-29 MED ORDER — ONDANSETRON HCL 4 MG PO TABS: 4.0000 mg | ORAL_TABLET | Freq: Four times a day (QID) | ORAL | Status: DC | PRN

## 2017-10-29 MED ORDER — METHOCARBAMOL 500 MG PO TABS
500.0000 mg | ORAL_TABLET | Freq: Four times a day (QID) | ORAL | Status: DC | PRN
  Administered 2017-10-29 – 2017-10-30 (×3): 500 mg via ORAL
  Filled 2017-10-29 (×3): qty 1

## 2017-10-29 MED ORDER — FENTANYL CITRATE (PF) 100 MCG/2ML IJ SOLN: 25.0000 ug | INTRAMUSCULAR | Status: DC | PRN

## 2017-10-29 MED ORDER — SODIUM CHLORIDE 0.9% FLUSH
3.0000 mL | Freq: Two times a day (BID) | INTRAVENOUS | Status: DC
  Administered 2017-10-29: 3 mL via INTRAVENOUS

## 2017-10-29 MED ORDER — DOCUSATE SODIUM 100 MG PO CAPS
100.0000 mg | ORAL_CAPSULE | Freq: Two times a day (BID) | ORAL | Status: DC
  Administered 2017-10-29 – 2017-10-30 (×2): 100 mg via ORAL
  Filled 2017-10-29 (×2): qty 1

## 2017-10-29 MED ORDER — ACETAMINOPHEN 325 MG PO TABS
650.0000 mg | ORAL_TABLET | ORAL | Status: DC | PRN
  Administered 2017-10-30: 650 mg via ORAL
  Filled 2017-10-29: qty 2

## 2017-10-29 MED ORDER — PROPOFOL 10 MG/ML IV BOLUS
INTRAVENOUS | Status: DC | PRN
  Administered 2017-10-29: 200 mg via INTRAVENOUS
  Administered 2017-10-29: 60 mg via INTRAVENOUS

## 2017-10-29 MED ORDER — PROMETHAZINE HCL 25 MG/ML IJ SOLN: 6.2500 mg | INTRAMUSCULAR | Status: DC | PRN

## 2017-10-29 MED ORDER — ROSUVASTATIN CALCIUM 5 MG PO TABS
5.0000 mg | ORAL_TABLET | Freq: Every day | ORAL | Status: DC
  Administered 2017-10-30: 5 mg via ORAL
  Filled 2017-10-29: qty 1

## 2017-10-29 MED ORDER — ACETAMINOPHEN 650 MG RE SUPP: 650.0000 mg | RECTAL | Status: DC | PRN

## 2017-10-29 MED ORDER — MIDAZOLAM HCL 2 MG/2ML IJ SOLN
INTRAMUSCULAR | Status: AC
  Filled 2017-10-29: qty 2

## 2017-10-29 MED ORDER — CHLORHEXIDINE GLUCONATE 4 % EX LIQD: 60.0000 mL | Freq: Once | CUTANEOUS | Status: DC

## 2017-10-29 MED ORDER — ROCURONIUM BROMIDE 10 MG/ML (PF) SYRINGE
PREFILLED_SYRINGE | INTRAVENOUS | Status: DC | PRN
  Administered 2017-10-29 (×2): 10 mg via INTRAVENOUS
  Administered 2017-10-29: 50 mg via INTRAVENOUS

## 2017-10-29 MED ORDER — PROPOFOL 10 MG/ML IV BOLUS
INTRAVENOUS | Status: AC
  Filled 2017-10-29: qty 40

## 2017-10-29 MED ORDER — BUPIVACAINE-EPINEPHRINE 0.5% -1:200000 IJ SOLN
INTRAMUSCULAR | Status: DC | PRN
  Administered 2017-10-29: 7 mL

## 2017-10-29 MED ORDER — CLINDAMYCIN PHOSPHATE 900 MG/50ML IV SOLN: 900.0000 mg | INTRAVENOUS | Status: DC

## 2017-10-29 MED ORDER — FENTANYL CITRATE (PF) 100 MCG/2ML IJ SOLN
INTRAMUSCULAR | Status: DC | PRN
  Administered 2017-10-29 (×2): 50 ug via INTRAVENOUS
  Administered 2017-10-29: 100 ug via INTRAVENOUS

## 2017-10-29 MED ORDER — LIDOCAINE 2% (20 MG/ML) 5 ML SYRINGE
INTRAMUSCULAR | Status: DC | PRN
  Administered 2017-10-29: 100 mg via INTRAVENOUS

## 2017-10-29 MED ORDER — NEOSTIGMINE METHYLSULFATE 5 MG/5ML IV SOSY
PREFILLED_SYRINGE | INTRAVENOUS | Status: AC
  Filled 2017-10-29: qty 5

## 2017-10-29 MED ORDER — PHENYLEPHRINE HCL 10 MG/ML IJ SOLN
INTRAMUSCULAR | Status: DC | PRN
  Administered 2017-10-29: 15 ug/min via INTRAVENOUS

## 2017-10-29 MED ORDER — MORPHINE SULFATE (PF) 4 MG/ML IV SOLN: 2.0000 mg | INTRAVENOUS | Status: DC | PRN

## 2017-10-29 MED ORDER — LISINOPRIL 20 MG PO TABS
20.0000 mg | ORAL_TABLET | Freq: Every day | ORAL | Status: DC
  Administered 2017-10-30: 20 mg via ORAL
  Filled 2017-10-29: qty 1

## 2017-10-29 MED ORDER — METHOCARBAMOL 1000 MG/10ML IJ SOLN
500.0000 mg | Freq: Four times a day (QID) | INTRAVENOUS | Status: DC | PRN
  Filled 2017-10-29: qty 5

## 2017-10-29 MED ORDER — ACETAMINOPHEN 500 MG PO TABS
ORAL_TABLET | ORAL | Status: AC
  Filled 2017-10-29: qty 2

## 2017-10-29 MED ORDER — FLUTICASONE PROPIONATE 50 MCG/ACT NA SUSP
1.0000 | Freq: Every day | NASAL | Status: DC
  Filled 2017-10-29: qty 16

## 2017-10-29 MED ORDER — MENTHOL 3 MG MT LOZG
1.0000 | LOZENGE | OROMUCOSAL | Status: DC | PRN
  Filled 2017-10-29: qty 9

## 2017-10-29 MED ORDER — SODIUM CHLORIDE 0.9% FLUSH: 3.0000 mL | INTRAVENOUS | Status: DC | PRN

## 2017-10-29 MED ORDER — DEXAMETHASONE SODIUM PHOSPHATE 10 MG/ML IJ SOLN
INTRAMUSCULAR | Status: AC
  Filled 2017-10-29: qty 1

## 2017-10-29 MED ORDER — GABAPENTIN 300 MG PO CAPS
300.0000 mg | ORAL_CAPSULE | Freq: Once | ORAL | Status: AC
  Administered 2017-10-29: 300 mg via ORAL

## 2017-10-29 SURGICAL SUPPLY — 60 items
APL SKNCLS STERI-STRIP NONHPOA (GAUZE/BANDAGES/DRESSINGS) ×1
BENZOIN TINCTURE PRP APPL 2/3 (GAUZE/BANDAGES/DRESSINGS) ×3 IMPLANT
BIT DRILL SRG 14X2.2XFLT CHK (BIT) IMPLANT
BIT DRL SRG 14X2.2XFLT CHK (BIT) ×1
BLADE CLIPPER SURG (BLADE) IMPLANT
BONE CERV LORDOTIC 14.5X12X7 (Bone Implant) ×3 IMPLANT
BUR ROUND FLUTED 4 SOFT TCH (BURR) IMPLANT
BUR ROUND FLUTED 4MM SOFT TCH (BURR)
CLOSURE WOUND 1/2 X4 (GAUZE/BANDAGES/DRESSINGS) ×1
COLLAR CERV LO CONTOUR FIRM DE (SOFTGOODS) ×3 IMPLANT
CORDS BIPOLAR (ELECTRODE) ×2 IMPLANT
COVER MAYO STAND STRL (DRAPES) ×3 IMPLANT
COVER SURGICAL LIGHT HANDLE (MISCELLANEOUS) ×3 IMPLANT
CRADLE DONUT ADULT HEAD (MISCELLANEOUS) ×3 IMPLANT
DRAPE C-ARM 42X72 X-RAY (DRAPES) ×3 IMPLANT
DRAPE HALF SHEET 40X57 (DRAPES) ×5 IMPLANT
DRAPE MICROSCOPE LEICA (MISCELLANEOUS) ×3 IMPLANT
DRILL BIT SKYLINE 14MM (BIT) ×3
DURAPREP 6ML APPLICATOR 50/CS (WOUND CARE) ×3 IMPLANT
ELECT COATED BLADE 2.86 ST (ELECTRODE) ×3 IMPLANT
ELECT REM PT RETURN 9FT ADLT (ELECTROSURGICAL) ×3
ELECTRODE REM PT RTRN 9FT ADLT (ELECTROSURGICAL) ×1 IMPLANT
EVACUATOR 1/8 PVC DRAIN (DRAIN) ×3 IMPLANT
GAUZE SPONGE 4X4 12PLY STRL (GAUZE/BANDAGES/DRESSINGS) ×3 IMPLANT
GAUZE XEROFORM 1X8 LF (GAUZE/BANDAGES/DRESSINGS) ×6 IMPLANT
GLOVE BIOGEL PI IND STRL 8 (GLOVE) ×2 IMPLANT
GLOVE BIOGEL PI INDICATOR 8 (GLOVE) ×4
GLOVE ORTHO TXT STRL SZ7.5 (GLOVE) ×6 IMPLANT
GOWN STRL REUS W/ TWL LRG LVL3 (GOWN DISPOSABLE) ×1 IMPLANT
GOWN STRL REUS W/ TWL XL LVL3 (GOWN DISPOSABLE) ×1 IMPLANT
GOWN STRL REUS W/TWL 2XL LVL3 (GOWN DISPOSABLE) ×3 IMPLANT
GOWN STRL REUS W/TWL LRG LVL3 (GOWN DISPOSABLE) ×3
GOWN STRL REUS W/TWL XL LVL3 (GOWN DISPOSABLE) ×3
GRAFT BNE SPCR VG2 14.5X12X7 (Bone Implant) IMPLANT
HEAD HALTER (SOFTGOODS) ×3 IMPLANT
HEMOSTAT SURGICEL 2X14 (HEMOSTASIS) IMPLANT
KIT BASIN OR (CUSTOM PROCEDURE TRAY) ×3 IMPLANT
KIT ROOM TURNOVER OR (KITS) ×3 IMPLANT
MANIFOLD NEPTUNE II (INSTRUMENTS) ×3 IMPLANT
NDL 25GX 5/8IN NON SAFETY (NEEDLE) ×1 IMPLANT
NEEDLE 25GX 5/8IN NON SAFETY (NEEDLE) ×3 IMPLANT
NS IRRIG 1000ML POUR BTL (IV SOLUTION) ×3 IMPLANT
PACK ORTHO CERVICAL (CUSTOM PROCEDURE TRAY) ×3 IMPLANT
PAD ARMBOARD 7.5X6 YLW CONV (MISCELLANEOUS) ×6 IMPLANT
PATTIES SURGICAL .5 X.5 (GAUZE/BANDAGES/DRESSINGS) IMPLANT
PIN TEMP SKYLINE THREADED (PIN) ×2 IMPLANT
PLATE ONE LEVEL SKYLINE 14MM (Plate) ×2 IMPLANT
SCREW VAR SELF TAP SKYLINE 14M (Screw) ×8 IMPLANT
STRIP CLOSURE SKIN 1/2X4 (GAUZE/BANDAGES/DRESSINGS) ×2 IMPLANT
STRIP SURGICAL 1/2 X 6 IN (GAUZE/BANDAGES/DRESSINGS) ×2 IMPLANT
SURGIFLO W/THROMBIN 8M KIT (HEMOSTASIS) IMPLANT
SUT BONE WAX W31G (SUTURE) ×3 IMPLANT
SUT VIC AB 3-0 X1 27 (SUTURE) ×3 IMPLANT
SUT VICRYL 4-0 PS2 18IN ABS (SUTURE) ×6 IMPLANT
SYR 30ML SLIP (SYRINGE) ×3 IMPLANT
SYR BULB 3OZ (MISCELLANEOUS) ×3 IMPLANT
TAPE CLOTH SURG 6X10 WHT LF (GAUZE/BANDAGES/DRESSINGS) ×2 IMPLANT
TOWEL OR 17X24 6PK STRL BLUE (TOWEL DISPOSABLE) ×3 IMPLANT
TOWEL OR 17X26 10 PK STRL BLUE (TOWEL DISPOSABLE) ×3 IMPLANT
WATER STERILE IRR 1000ML POUR (IV SOLUTION) ×3 IMPLANT

## 2017-10-29 NOTE — Anesthesia Postprocedure Evaluation (Signed)
Anesthesia Post Note  Patient: Marion DownerRandy D Perdue  Procedure(s) Performed: REMOVAL C5-6 PLATE, Z6-1C6-7 ANTERIOR CERVICAL DISCECTOMY AND FUSION, ALLOGRAFT, AND PLATE (N/A )     Patient location during evaluation: PACU Anesthesia Type: General Level of consciousness: awake and alert Pain management: pain level controlled Vital Signs Assessment: post-procedure vital signs reviewed and stable Respiratory status: spontaneous breathing, nonlabored ventilation and respiratory function stable Cardiovascular status: blood pressure returned to baseline and stable Postop Assessment: no apparent nausea or vomiting Anesthetic complications: no    Last Vitals:  Vitals:   10/29/17 1557 10/29/17 1612  BP: (!) 143/73 139/75  Pulse: 68 74  Resp: 13 12  Temp:  36.6 C  SpO2: 96% 95%    Last Pain:  Vitals:   10/29/17 1542  TempSrc:   PainSc: 2                  Trevor IhaStephen A Muhamad Serano

## 2017-10-29 NOTE — Interval H&P Note (Signed)
History and Physical Interval Note:  10/29/2017 12:12 PM  Ethan Green  has presented today for surgery, with the diagnosis of C6-7 Spondylosis, Disc Protrusion  The various methods of treatment have been discussed with the patient and family. After consideration of risks, benefits and other options for treatment, the patient has consented to  Procedure(s): REMOVAL C5-6 PLATE, Z6-1C6-7 ANTERIOR CERVICAL DISCECTOMY AND FUSION, ALLOGRAFT, AND PLATE (N/A) as a surgical intervention .  The patient's history has been reviewed, patient examined, no change in status, stable for surgery.  I have reviewed the patient's chart and labs.  Questions were answered to the patient's satisfaction.     Eldred MangesMark C Marwa Fuhrman

## 2017-10-29 NOTE — Progress Notes (Signed)
Orthopedic Tech Progress Note Patient Details:  Marion DownerRandy D Boomershine 07-10-1966 [id number] Patient has collar. Patient ID: Marion DownerRandy D Mendez, male   DOB: 07-10-1966, 52 y.o.   MRN: [id number]   Jennye MoccasinHughes, Estell Puccini Craig 10/29/2017, 4:32 PM

## 2017-10-29 NOTE — Anesthesia Preprocedure Evaluation (Addendum)
Anesthesia Evaluation  Patient identified by MRN, date of birth, ID band Patient awake    Reviewed: Allergy & Precautions, NPO status , Patient's Chart, lab work & pertinent test results  Airway Mallampati: III  TM Distance: >3 FB Neck ROM: Limited    Dental  (+) Teeth Intact, Dental Advisory Given, Poor Dentition   Pulmonary Current Smoker,    Pulmonary exam normal breath sounds clear to auscultation       Cardiovascular hypertension, Pt. on medications (-) angina(-) CAD, (-) Past MI and (-) CHF Normal cardiovascular exam Rhythm:Regular Rate:Normal     Neuro/Psych PSYCHIATRIC DISORDERS Anxiety C6-7 Spondylosis, disc protrusion    GI/Hepatic negative GI ROS, Neg liver ROS,   Endo/Other  negative endocrine ROS  Renal/GU negative Renal ROS     Musculoskeletal negative musculoskeletal ROS (+)   Abdominal   Peds  Hematology negative hematology ROS (+)   Anesthesia Other Findings Day of surgery medications reviewed with the patient.  Reproductive/Obstetrics                            Anesthesia Physical Anesthesia Plan  ASA: II  Anesthesia Plan: General   Post-op Pain Management:    Induction: Intravenous  PONV Risk Score and Plan: 1 and Dexamethasone, Ondansetron and Midazolam  Airway Management Planned: Oral ETT and Video Laryngoscope Planned  Additional Equipment:   Intra-op Plan:   Post-operative Plan: Extubation in OR  Informed Consent: I have reviewed the patients History and Physical, chart, labs and discussed the procedure including the risks, benefits and alternatives for the proposed anesthesia with the patient or authorized representative who has indicated his/her understanding and acceptance.   Dental advisory given  Plan Discussed with: CRNA  Anesthesia Plan Comments: (Risks/benefits of general anesthesia discussed with patient including risk of damage to teeth,  lips, gum, and tongue, nausea/vomiting, allergic reactions to medications, and the possibility of heart attack, stroke and death.  All patient questions answered.  Patient wishes to proceed.)        Anesthesia Quick Evaluation

## 2017-10-29 NOTE — Op Note (Signed)
Preop diagnosis: C6-7 spondylosis with previous C5-6 fusion with plating.  Postoperative diagnosis: Same  Procedure: Removal of C5-6 anterior cervical plate.  C6-7 anterior cervical discectomy and fusion, allograft and plate.  Surgeon: Annell GreeningMark Obediah Welles MD  Assistant: Zonia KiefJames Owens PA-C medically necessary and present for the entire procedure  Anesthesia: General orotracheal +7 cc Marcaine skin local  EBL: 100 cc  Procedure: After induction of general anesthesia orotracheal intubation tacking of the arms at the side with padding over the ulnar nerve wrist restraints were tied and secured available for pulled down during C arm visualization during the procedure.  Neck was prepped with DuraPrep after head halter traction was applied without weight.  DuraPrep squaring the area with towels Betadine Steri-Drape after outlining the old incision starting sterile male standard at the head thyroid sheets and drapes were applied.  Timeout procedure was completed clindamycin was given preoperatively due to the patient's amoxicillin allergy.  Incision was made starting at the midline extending to the left using the old scar.  Blunt dissection after dividing the platysma was performed down to the longus Coley muscle and the old plate was visualized.  Soft tissue was removed off the plate and using Codman screwdriver all 4 tiny locking screws were loosened and all 4 screws were removed from the plate.  Plate was elevated with a Therapist, nutritionalreer elevator and removed.  Fusion was solid and bleeding from the screw holes was controlled with bone wax.  Soft and flat retractor was placed at the C6-7 level with teeth blades right and left smooth plates cephalad and caudad.  Discectomy was performed operative microscope was draped and brought in posterior longitudinal ligament was taken down under microscopic visualization removing the bulging disc and spurs.  Uncovertebral joint was stripped with the Cloward curettes.  Complete  decompression of the dura was performed.  Epidural space was dry.  Anterior spurs were removed off of the C6-7 level of the plate would sit down flat and a 14 mm skyline Depew plate was selected with 14 mm screws x4.  For the plate was placed trial sizers with a 6 and 7 graft was performed.  7 graft gave a nice tight fit without over distraction.  Graft was inserted countersunk 2 mm after to been marked anteriorly in the midline.  Graft was at the midline there was room right and left for egressive any epidural bleeding which was dry.  The plate was applied screw holes were placed C-arm was brought in.  Despite pulled down on the traction at the wrist on lateral x-ray the plate cannot be visualized but was visualized with an oblique view at 20-30 degrees.  AP x-ray showed it was at the midline in good position and all screws in the vertebral body not catching the graft and appropriately angled.  Irrigation with saline solution.  Careful inspection showed there was no venous or arterial bleeding.  Hemovac was placed on the left side of the incision within and out technique using the trocar.  Platysma closed with 3-0 Vicryl 4-0 Vicryl subcuticular closure tincture benzoin Steri-Strips Marcaine infiltration postop 4 x 4's tape and soft cervical collar was applied.  Patient was transferred to recovery room and was neurologically intact.

## 2017-10-29 NOTE — Transfer of Care (Signed)
Immediate Anesthesia Transfer of Care Note  Patient: Ethan Green  Procedure(s) Performed: REMOVAL C5-6 PLATE, Z6-1C6-7 ANTERIOR CERVICAL DISCECTOMY AND FUSION, ALLOGRAFT, AND PLATE (N/A )  Patient Location: PACU  Anesthesia Type:General  Level of Consciousness: awake, alert , oriented and patient cooperative  Airway & Oxygen Therapy: Patient Spontanous Breathing and Patient connected to face mask oxygen  Post-op Assessment: Report given to RN, Post -op Vital signs reviewed and stable and Patient moving all extremities X 4  Post vital signs: Reviewed and stable  Last Vitals:  Vitals:   10/29/17 1037  BP: (!) 166/74  Pulse: 89  Resp: 18  Temp: 36.8 C  SpO2: 98%    Last Pain:  Vitals:   10/29/17 1037  TempSrc: Oral         Complications: No apparent anesthesia complications

## 2017-10-29 NOTE — Progress Notes (Signed)
Orthopedic Tech Progress Note Patient Details:  Marion DownerRandy D Wildasin 1966-10-13 [id number]  Ortho Devices Type of Ortho Device: Soft collar Ortho Device/Splint Location: for showers Ortho Device/Splint Interventions: Casandra DoffingOrdered       Keyandra Swenson Craig 10/29/2017, 5:33 PM

## 2017-10-29 NOTE — Anesthesia Procedure Notes (Signed)
Procedure Name: Intubation Date/Time: 10/29/2017 12:53 PM Performed by: Waynard EdwardsSmith, Dari Carpenito A, CRNA Pre-anesthesia Checklist: Patient identified, Emergency Drugs available, Suction available and Patient being monitored Patient Re-evaluated:Patient Re-evaluated prior to induction Oxygen Delivery Method: Circle system utilized Preoxygenation: Pre-oxygenation with 100% oxygen Induction Type: IV induction Ventilation: Mask ventilation without difficulty and Oral airway inserted - appropriate to patient size Laryngoscope Size: Glidescope and 4 Grade View: Grade I Tube type: Oral Tube size: 8.0 mm Number of attempts: 1 Airway Equipment and Method: Video-laryngoscopy and Rigid stylet Placement Confirmation: ETT inserted through vocal cords under direct vision,  positive ETCO2 and breath sounds checked- equal and bilateral Secured at: 23 cm Tube secured with: Tape Dental Injury: Teeth and Oropharynx as per pre-operative assessment

## 2017-10-30 ENCOUNTER — Other Ambulatory Visit: Payer: Self-pay

## 2017-10-30 DIAGNOSIS — M50121 Cervical disc disorder at C4-C5 level with radiculopathy: Secondary | ICD-10-CM | POA: Diagnosis not present

## 2017-10-30 MED ORDER — OXYCODONE-ACETAMINOPHEN 5-325 MG PO TABS: 1.0000 | ORAL_TABLET | Freq: Four times a day (QID) | ORAL | 0 refills | Status: AC | PRN

## 2017-10-30 MED ORDER — METHOCARBAMOL 500 MG PO TABS: 500.0000 mg | ORAL_TABLET | Freq: Four times a day (QID) | ORAL | 0 refills | Status: DC | PRN

## 2017-10-30 NOTE — Progress Notes (Signed)
   Subjective: 1 Day Post-Op Procedure(s) (LRB): REMOVAL C5-6 PLATE, J4-7C6-7 ANTERIOR CERVICAL DISCECTOMY AND FUSION, ALLOGRAFT, AND PLATE (N/A) Patient reports pain as mild.    Objective: Vital signs in last 24 hours: Temp:  [97.8 F (36.6 C)-98.8 F (37.1 C)] 98.2 F (36.8 C) (01/17 0742) Pulse Rate:  [60-94] 60 (01/17 0742) Resp:  [12-20] 16 (01/17 0742) BP: (121-166)/(68-80) 141/78 (01/17 0742) SpO2:  [94 %-98 %] 98 % (01/17 0742) Weight:  [193 lb 6.4 oz (87.7 kg)] 193 lb 6.4 oz (87.7 kg) (01/16 1037)  Intake/Output from previous day: 01/16 0701 - 01/17 0700 In: 1820 [P.O.:720; I.V.:1050; IV Piggyback:50] Out: 115 [Drains:15; Blood:100] Intake/Output this shift: No intake/output data recorded.  Recent Labs    10/27/17 1540  HGB 16.4   Recent Labs    10/27/17 1540  WBC 9.4  RBC 5.12  HCT 46.6  PLT 213   Recent Labs    10/27/17 1540  NA 136  K 3.9  CL 100*  CO2 26  BUN 8  CREATININE 0.97  GLUCOSE 139*  CALCIUM 9.6   No results for input(s): LABPT, INR in the last 72 hours.  Neurologically intact Dg Cervical Spine 2 Or 3 Views  Result Date: 10/29/2017 CLINICAL DATA:  C6-C7 ACDF. EXAM: DG C-ARM 61-120 MIN; CERVICAL SPINE - 2-3 VIEW COMPARISON:  MRI cervical spine dated October 02, 2017. FINDINGS: Intraoperative x-rays demonstrate interval C6-C7 ACDF and removal of C5 screws. Solid osseous fusion at C5-C6. Alignment is normal. IMPRESSION: 1. Interval C6-C7 ACDF and removal of C5 screws. FLUOROSCOPY TIME:  9 seconds. C-arm fluoroscopic images were obtained intraoperatively and submitted for post operative interpretation. Electronically Signed   By: Obie DredgeWilliam T Derry M.D.   On: 10/29/2017 16:07   Dg C-arm 1-60 Min  Result Date: 10/29/2017 CLINICAL DATA:  C6-C7 ACDF. EXAM: DG C-ARM 61-120 MIN; CERVICAL SPINE - 2-3 VIEW COMPARISON:  MRI cervical spine dated October 02, 2017. FINDINGS: Intraoperative x-rays demonstrate interval C6-C7 ACDF and removal of C5 screws.  Solid osseous fusion at C5-C6. Alignment is normal. IMPRESSION: 1. Interval C6-C7 ACDF and removal of C5 screws. FLUOROSCOPY TIME:  9 seconds. C-arm fluoroscopic images were obtained intraoperatively and submitted for post operative interpretation. Electronically Signed   By: Obie DredgeWilliam T Derry M.D.   On: 10/29/2017 16:07    Assessment/Plan: 1 Day Post-Op Procedure(s) (LRB): REMOVAL C5-6 PLATE, W2-9C6-7 ANTERIOR CERVICAL DISCECTOMY AND FUSION, ALLOGRAFT, AND PLATE (N/A) Plan:  Discharge home. Office one week.   Eldred MangesMark C Chiquitta Matty 10/30/2017, 8:04 AM

## 2017-10-30 NOTE — Discharge Instructions (Signed)
Keep collar on at all times, remove only to switch to saran wrapped extra collar for showers. Walk daily. See dr. Ophelia CharterYates in one week.

## 2017-10-30 NOTE — Progress Notes (Signed)
Patient is discharged from room 3C05 at this time. Alert and in stable condition. IV site d/c'd and instructions read to patient with understanding verbalized. Left unit via wheelchair with all belongings at side. 

## 2017-10-31 NOTE — Discharge Summary (Signed)
Patient ID: Ethan Green MRN: [id number] DOB/AGE: 1965/12/18 52 y.o.  Admit date: 10/29/2017 Discharge date: 10/31/2017  Admission Diagnoses:  Active Problems:   Cervical spinal stenosis   Discharge Diagnoses:  Active Problems:   Cervical spinal stenosis  status post Procedure(s): REMOVAL C5-6 PLATE, N8-2 ANTERIOR CERVICAL DISCECTOMY AND FUSION, ALLOGRAFT, AND PLATE  Past Medical History:  Diagnosis Date  . Anxiety   . Borderline diabetes   . Hypertension     Surgeries: Procedure(s): REMOVAL C5-6 PLATE, N5-6 ANTERIOR CERVICAL DISCECTOMY AND FUSION, ALLOGRAFT, AND PLATE on 11/27/863   Consultants:   Discharged Condition: Improved  Hospital Course: Ethan Green is an 52 y.o. male who was admitted 10/29/2017 for operative treatment of cervical stenosis. Patient failed conservative treatments (please see the history and physical for the specifics) and had severe unremitting pain that affects sleep, daily activities and work/hobbies. After pre-op clearance, the patient was taken to the operating room on 10/29/2017 and underwent  Procedure(s): REMOVAL C5-6 PLATE, H8-4 ANTERIOR CERVICAL DISCECTOMY AND FUSION, ALLOGRAFT, AND PLATE.    Patient was given perioperative antibiotics:  Anti-infectives (From admission, onward)   Start     Dose/Rate Route Frequency Ordered Stop   10/29/17 1100  clindamycin (CLEOCIN) IVPB 900 mg     900 mg 100 mL/hr over 30 Minutes Intravenous To ShortStay Surgical 10/28/17 1426 10/29/17 1248   10/29/17 1033  clindamycin (CLEOCIN) IVPB 900 mg  Status:  Discontinued     900 mg 100 mL/hr over 30 Minutes Intravenous On call to O.R. 10/29/17 1033 10/29/17 1627       Patient was given sequential compression devices and early ambulation to prevent DVT.   Patient benefited maximally from hospital stay and there were no complications. At the time of discharge, the patient was urinating/moving their bowels without difficulty, tolerating a regular diet,  pain is controlled with oral pain medications and they have been cleared by PT/OT.   Recent vital signs: No data found.   Recent laboratory studies: No results for input(s): WBC, HGB, HCT, PLT, NA, K, CL, CO2, BUN, CREATININE, GLUCOSE, INR, CALCIUM in the last 72 hours.  Invalid input(s): PT, 2   Discharge Medications:   Allergies as of 10/30/2017      Reactions   Amoxicillin Itching   Has patient had a PCN reaction causing immediate rash, facial/tongue/throat swelling, SOB or lightheadedness with hypotension: Unknown Has patient had a PCN reaction causing severe rash involving mucus membranes or skin necrosis: Unknown Has patient had a PCN reaction that required hospitalization: No Has patient had a PCN reaction occurring within the last 10 years: No If all of the above answers are "NO", then may proceed with Cephalosporin use.   Pravastatin    UNSPECIFIED REACTION       Medication List    TAKE these medications   cyclobenzaprine 10 MG tablet Commonly known as:  FLEXERIL Take 1 tablet (10 mg total) by mouth at bedtime.   fluticasone 50 MCG/ACT nasal spray Commonly known as:  FLONASE Place into both nostrils daily.   GOODY HEADACHE PO Take 2 packets by mouth daily.   hydrocortisone cream 0.5 % Apply 1 application topically 2 (two) times a week.   lisinopril 20 MG tablet Commonly known as:  PRINIVIL,ZESTRIL TAKE 1 TABLET EVERY DAY FOR HYPERTENSION   methocarbamol 500 MG tablet Commonly known as:  ROBAXIN Take 1 tablet (500 mg total) by mouth every 6 (six) hours as needed for muscle spasms.   oxyCODONE-acetaminophen 5-325  MG tablet Commonly known as:  ROXICET Take 1 tablet by mouth every 6 (six) hours as needed.   PARoxetine 10 MG tablet Commonly known as:  PAXIL TAKE 1 TABLET DAILY TO CONTROL ANXIETY   rosuvastatin 5 MG tablet Commonly known as:  CRESTOR TAKE 1 TABLET DAILY TO CONTROL CHOLESTEROL       Diagnostic Studies: Dg Eye Foreign Body  Result Date:  10/02/2017 CLINICAL DATA:  Metal working/exposure; clearance prior to MRI EXAM: ORBITS FOR FOREIGN BODY - 2 VIEW COMPARISON:  None. FINDINGS: There is no evidence of metallic foreign body within the orbits. No significant bone abnormality identified. The observed portions of the paranasal sinuses appear clear. IMPRESSION: No evidence of metallic foreign body within the orbits. Electronically Signed   By: Ethan  Green M.D.   On: 10/02/2017 15:01   Dg Chest 2 View  Result Date: 10/27/2017 CLINICAL DATA:  Preoperative chest x-ray prior to a ACDF with hardware removal. Current smoker. History of hypertension. No current chest complaints. EXAM: CHEST  2 VIEW COMPARISON:  Chest x-ray of December 11, 2005 FINDINGS: The lungs are adequately inflated. There is no focal infiltrate. The lung markings are coarse at both bases likely secondary to the patient's smoking history. The heart and pulmonary vascularity are normal. The mediastinum is normal in width. There is mild multilevel degenerative disc disease of the thoracic spine. IMPRESSION: Chronic bronchitic-smoking related changes.  No pneumonia nor CHF. Electronically Signed   By: Ethan  Green M.D.   On: 10/27/2017 16:49   Dg Cervical Spine 2 Or 3 Views  Result Date: 10/29/2017 CLINICAL DATA:  C6-C7 ACDF. EXAM: DG C-ARM 61-120 MIN; CERVICAL SPINE - 2-3 VIEW COMPARISON:  MRI cervical spine dated October 02, 2017. FINDINGS: Intraoperative x-rays demonstrate interval C6-C7 ACDF and removal of C5 screws. Solid osseous fusion at C5-C6. Alignment is normal. IMPRESSION: 1. Interval C6-C7 ACDF and removal of C5 screws. FLUOROSCOPY TIME:  9 seconds. C-arm fluoroscopic images were obtained intraoperatively and submitted for post operative interpretation. Electronically Signed   By: Ethan Green M.D.   On: 10/29/2017 16:07   Mr Cervical Spine W/o Contrast  Result Date: 10/02/2017 CLINICAL DATA:  Neck, left shoulder and left-side pain for 2 years, worsening.  History of prior cervical surgery. EXAM: MRI CERVICAL SPINE WITHOUT CONTRAST TECHNIQUE: Multiplanar, multisequence MR imaging of the cervical spine was performed. No intravenous contrast was administered. COMPARISON:  Two views of the cervical spine [facility]01/2017 [facility]. FINDINGS: Alignment: Maintained. Vertebrae: No fracture or worrisome lesion. Solid C5-6 ACDF is identified. Cord: Cervical cord signal is normal. Posterior Fossa, vertebral arteries, paraspinal tissues: Patchy T2 hyperintensity in the pons is most consistent with chronic microvascular ischemic change. Disc levels: C2-3: Facet degenerative disease on the left causes mild left foraminal narrowing. Otherwise negative. C3-4: Mild facet degenerative change bilaterally. Minimal disc bulge. The central canal and foramina are open. C4-5: Shallow broad-based central protrusion narrows but does not efface the ventral thecal sac. The central canal and foramina appear open. C5-6: Status post discectomy and fusion. The central canal and foramina are widely patent. C6-7: The patient has a disc osteophyte complex eccentric to the left and left much worse than right uncovertebral disease. The ventral thecal sac is effaced. Severe left and moderate to moderately severe right foraminal narrowing is present. C7-T1:  Negative. T1-2: Disc bulge without central canal stenosis. Mild to moderate foraminal narrowing appears worse on the right. IMPRESSION: Spondylosis appearing worst at C6-7 where a disc bulge effaces the ventral  thecal sac and uncovertebral disease causes severe left and moderate to moderately severe right foraminal narrowing. Shallow broad-based central protrusion at C4-5 effaces the ventral thecal sac but the central canal and foramina are open. Mild left foraminal narrowing C2-3 due to facet arthropathy. Solid C5-6 fusion. The central canal and foramina are widely patent. Electronically Signed   By: Drusilla Kanner M.D.   On: 10/02/2017  15:55   Dg C-arm 1-60 Min  Result Date: 10/29/2017 CLINICAL DATA:  C6-C7 ACDF. EXAM: DG C-ARM 61-120 MIN; CERVICAL SPINE - 2-3 VIEW COMPARISON:  MRI cervical spine dated October 02, 2017. FINDINGS: Intraoperative x-rays demonstrate interval C6-C7 ACDF and removal of C5 screws. Solid osseous fusion at C5-C6. Alignment is normal. IMPRESSION: 1. Interval C6-C7 ACDF and removal of C5 screws. FLUOROSCOPY TIME:  9 seconds. C-arm fluoroscopic images were obtained intraoperatively and submitted for post operative interpretation. Electronically Signed   By: Ethan Dredge M.D.   On: 10/29/2017 16:07      Follow-up Information    Eldred Manges, MD Follow up in 1 week(s).   Specialty:  Orthopedic Surgery Contact information: [address] [place] [address] [phone]           Discharge Plan:  discharge to home  Disposition:     Signed: Zonia Kief  10/31/2017, 3:13 PM

## 2017-11-03 ENCOUNTER — Telehealth (INDEPENDENT_AMBULATORY_CARE_PROVIDER_SITE_OTHER): Payer: Self-pay | Admitting: Radiology

## 2017-11-03 ENCOUNTER — Encounter (HOSPITAL_COMMUNITY): Payer: Self-pay | Admitting: Orthopaedic Surgery

## 2017-11-03 NOTE — Telephone Encounter (Signed)
Pt states that he had neck surgery last Wednesday and he states that he is having increased left shoulder pain.  He states that he would like something stronger for pain. Please call him back to advise.  Thanks.

## 2017-11-03 NOTE — Telephone Encounter (Signed)
Can you please add patient to schedule Wed. 1/23 at 0845 for post op neck? Thanks.  He is aware of appt date and time.

## 2017-11-03 NOTE — Telephone Encounter (Signed)
I called, he will add  aleve or advil, call pt and have him come in Wed for recheck so we can check his neck xrays and get shoulder xrays and shoulder exam thanks.

## 2017-11-03 NOTE — Telephone Encounter (Signed)
Please advise. Patient was prescribed Oxycodone 5/325 after surgery.

## 2017-11-05 ENCOUNTER — Ambulatory Visit (INDEPENDENT_AMBULATORY_CARE_PROVIDER_SITE_OTHER): Payer: BLUE CROSS/BLUE SHIELD | Admitting: Orthopaedic Surgery

## 2017-11-05 ENCOUNTER — Ambulatory Visit (INDEPENDENT_AMBULATORY_CARE_PROVIDER_SITE_OTHER): Payer: BLUE CROSS/BLUE SHIELD

## 2017-11-05 ENCOUNTER — Encounter (INDEPENDENT_AMBULATORY_CARE_PROVIDER_SITE_OTHER): Payer: Self-pay | Admitting: Orthopaedic Surgery

## 2017-11-05 VITALS — BP 155/87 | HR 71 | Ht 70.0 in | Wt 193.0 lb

## 2017-11-05 DIAGNOSIS — M25512 Pain in left shoulder: Secondary | ICD-10-CM

## 2017-11-05 DIAGNOSIS — Z981 Arthrodesis status: Secondary | ICD-10-CM | POA: Diagnosis not present

## 2017-11-05 NOTE — Progress Notes (Signed)
   Post-Op Visit Note   Patient: Ethan DownerRandy D Mijangos           Date of Birth: [birth year]           MRN: [id number] Visit Date: 11/05/2017 PCP: Barbie BannerWilson, Fred H, MD   Assessment & Plan:  Chief Complaint:  Chief Complaint  Patient presents with  . Neck - Routine Post Op  . Left Shoulder - Pain   Visit Diagnoses:  1. Status post cervical spinal fusion   2. Acute pain of left shoulder     Plan: X-rays look good.  He still having some discomfort in his left shoulder.  I will recheck him again in 5 weeks for lateral C-spine flexion-extension x-ray.  Patient had some left shoulder pain shoulder exam is negative for impingement no instability good range of motion active and passive.  Patient is happy with the surgical result.  Follow-Up Instructions: Return in about 5 weeks (around 12/10/2017).   Orders:  Orders Placed This Encounter  Procedures  . XR Cervical Spine 2 or 3 views  . XR Shoulder Left   No orders of the defined types were placed in this encounter.   Imaging: Xr Cervical Spine 2 Or 3 Views  Result Date: 11/05/2017 AP and lateral C-spine and swimmer's x-ray obtained.  Comparison previous x-rays show removal of previous cervical plate at the W0-9C5-6 level and interbody fusion with plate and 4 screws at C6-7. Impression: Satisfactory postop x-rays fusion at C6-7 with plate graft and screws.  Removal of 5-6 plate  Xr Shoulder Left  Result Date: 11/05/2017 Three-view x-rays left shoulder obtained.  Normal anatomy no acute changes no glenohumeral arthritis acromioclavicular joint is normal. Impression: Normal left shoulder x-rays   PMFS History: Patient Active Problem List   Diagnosis Date Noted  . Cervical spinal stenosis 10/29/2017   Past Medical History:  Diagnosis Date  . Anxiety   . Borderline diabetes   . Hypertension     History reviewed. No pertinent family history.  Past Surgical History:  Procedure Laterality Date  . ANTERIOR CERVICAL DECOMP/DISCECTOMY FUSION  N/A 10/29/2017   Procedure: REMOVAL C5-6 PLATE, W1-1C6-7 ANTERIOR CERVICAL DISCECTOMY AND FUSION, ALLOGRAFT, AND PLATE;  Surgeon: Eldred MangesYates, Lillian Ballester C, MD;  Location: [facility] OR;  Service: Orthopedics;  Laterality: N/A;  . CERVICAL SPINE SURGERY     2005  . HIATAL HERNIA REPAIR     1990  . NASAL SINUS SURGERY     2002   Social History   Occupational History  . Not on file  Tobacco Use  . Smoking status: Current Every Day Smoker    Packs/day: 1.50    Types: Cigarettes  . Smokeless tobacco: Never Used  Substance and Sexual Activity  . Alcohol use: Yes    Comment: rarely   . Drug use: Yes    Types: Marijuana    Comment: weekly  . Sexual activity: Not on file

## 2017-11-12 ENCOUNTER — Inpatient Hospital Stay (INDEPENDENT_AMBULATORY_CARE_PROVIDER_SITE_OTHER): Payer: BLUE CROSS/BLUE SHIELD | Admitting: Orthopaedic Surgery

## 2017-11-13 ENCOUNTER — Telehealth (INDEPENDENT_AMBULATORY_CARE_PROVIDER_SITE_OTHER): Payer: Self-pay | Admitting: Orthopaedic Surgery

## 2017-11-13 NOTE — Telephone Encounter (Signed)
I called patient and advised. 

## 2017-11-13 NOTE — Telephone Encounter (Signed)
Please advise 

## 2017-11-13 NOTE — Telephone Encounter (Signed)
Patient had a filling to fall out and dentist office wants to make 100% sure that it is ok to replace it due to the surgery patient had. Please advise # [phone]

## 2017-11-13 NOTE — Telephone Encounter (Signed)
Yes ok to proceed

## 2017-11-20 ENCOUNTER — Telehealth (INDEPENDENT_AMBULATORY_CARE_PROVIDER_SITE_OTHER): Payer: Self-pay | Admitting: Orthopaedic Surgery

## 2017-11-20 MED ORDER — TRAMADOL HCL 50 MG PO TABS: 50.0000 mg | ORAL_TABLET | Freq: Two times a day (BID) | ORAL | 0 refills | Status: DC | PRN

## 2017-11-20 NOTE — Telephone Encounter (Signed)
Time to stop percocet. Change to ultram # 20  1 po bid prn pain thanks

## 2017-11-20 NOTE — Telephone Encounter (Signed)
I called to pharmacy. I attempted to reach patient to advise but unable to leave a voicemail.  Could you please call patient to advise? Thanks.

## 2017-11-20 NOTE — Telephone Encounter (Signed)
Patient requesting RX refill on Oxycodone. Please advise # [phone]

## 2017-11-20 NOTE — Telephone Encounter (Signed)
Please advise 

## 2017-11-21 NOTE — Telephone Encounter (Signed)
IC patient and advised.  

## 2017-12-09 ENCOUNTER — Ambulatory Visit (INDEPENDENT_AMBULATORY_CARE_PROVIDER_SITE_OTHER): Payer: BLUE CROSS/BLUE SHIELD

## 2017-12-09 ENCOUNTER — Encounter (INDEPENDENT_AMBULATORY_CARE_PROVIDER_SITE_OTHER): Payer: Self-pay | Admitting: Orthopaedic Surgery

## 2017-12-09 ENCOUNTER — Ambulatory Visit (INDEPENDENT_AMBULATORY_CARE_PROVIDER_SITE_OTHER): Payer: BLUE CROSS/BLUE SHIELD | Admitting: Orthopaedic Surgery

## 2017-12-09 VITALS — BP 145/87 | HR 75 | Ht 70.0 in | Wt 195.0 lb

## 2017-12-09 DIAGNOSIS — M4322 Fusion of spine, cervical region: Secondary | ICD-10-CM

## 2017-12-09 NOTE — Progress Notes (Signed)
   Post-Op Visit Note   Patient: Ethan Green           Date of Birth: [birth year]           MRN: [id number] Visit Date: 12/09/2017 PCP: Barbie BannerWilson, Fred H, MD   Assessment & Plan: Follow-up C6-7 fusion allograft and plate below previous [place] C5-6 fusion years ago.  He states he is about 75 to percent relief of his preop symptoms.  Lateral aspect of the incision had a little area with some drainage he states he squeezed out some pus.  He did have a positive PCR for MSSA and I will put him on some doxycycline 100 mg 1 p.o. twice daily times 10 days.  If he has persistent problems with incision he can return next week.  Chief Complaint:  Chief Complaint  Patient presents with  . Neck - Routine Post Op, Follow-up   Visit Diagnoses:  1. Fusion of spine, cervical region     Plan: Work slip given for work resumption on March 4 without restrictions.  We will place him on some doxycycline for the lateral aspect in the incision which is red but no purulent drainage currently.  Follow-Up Instructions: No Follow-up on file.   Orders:  Orders Placed This Encounter  Procedures  . XR Cervical Spine 2 or 3 views   No orders of the defined types were placed in this encounter.   Imaging: No results found.  PMFS History: Patient Active Problem List   Diagnosis Date Noted  . Cervical spinal stenosis 10/29/2017   Past Medical History:  Diagnosis Date  . Anxiety   . Borderline diabetes   . Hypertension     No family history on file.  Past Surgical History:  Procedure Laterality Date  . ANTERIOR CERVICAL DECOMP/DISCECTOMY FUSION N/A 10/29/2017   Procedure: REMOVAL C5-6 PLATE, W0-9C6-7 ANTERIOR CERVICAL DISCECTOMY AND FUSION, ALLOGRAFT, AND PLATE;  Surgeon: Eldred MangesYates, Sakura Denis C, MD;  Location: [facility] OR;  Service: Orthopedics;  Laterality: N/A;  . CERVICAL SPINE SURGERY     2005  . HIATAL HERNIA REPAIR     1990  . NASAL SINUS SURGERY     2002   Social History   Occupational History  . Not on  file  Tobacco Use  . Smoking status: Current Every Day Smoker    Packs/day: 1.50    Types: Cigarettes  . Smokeless tobacco: Never Used  Substance and Sexual Activity  . Alcohol use: Yes    Comment: rarely   . Drug use: Yes    Types: Marijuana    Comment: weekly  . Sexual activity: Not on file

## 2019-02-23 ENCOUNTER — Ambulatory Visit
Admission: RE | Admit: 2019-02-23 | Discharge: 2019-02-23 | Disposition: A | Discharge status: Home / Self Care | Payer: BLUE CROSS/BLUE SHIELD | Source: Ambulatory Visit | Attending: Family Medicine | Admitting: Family Medicine

## 2019-02-23 ENCOUNTER — Other Ambulatory Visit: Payer: Self-pay | Admitting: Family Medicine

## 2019-02-23 ENCOUNTER — Other Ambulatory Visit: Payer: Self-pay

## 2019-02-23 DIAGNOSIS — R109 Unspecified abdominal pain: Secondary | ICD-10-CM

## 2019-02-23 DIAGNOSIS — R319 Hematuria, unspecified: Secondary | ICD-10-CM

## 2019-05-28 IMAGING — MR MR CERVICAL SPINE W/O CM
5 series · 28 of 48 positions shown · non-contrast
Comparison: Two views of the cervical spine 07/01/2017 from
[REDACTED].

CLINICAL DATA: Neck, left shoulder and left-side pain for 2 years,
worsening. History of prior cervical surgery.

EXAM:
MRI CERVICAL SPINE WITHOUT CONTRAST
TECHNIQUE: Multiplanar, multisequence MR imaging of the cervical spine was
performed. No intravenous contrast was administered.

[Series 3: tir sag · sagittal · 3.0mm · 0.41mm/px · 6 of 13 slices shown]
[im 1/13]
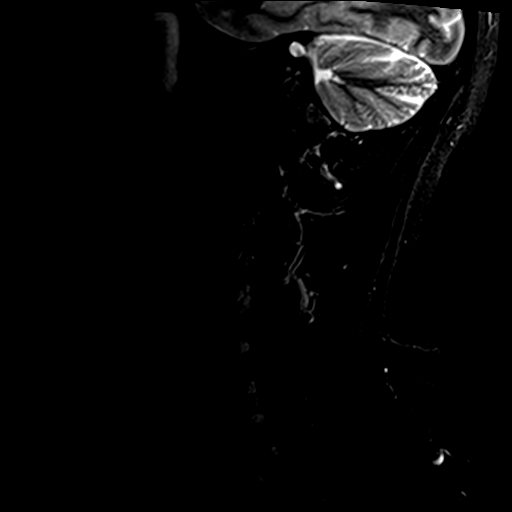
[im 3/13]
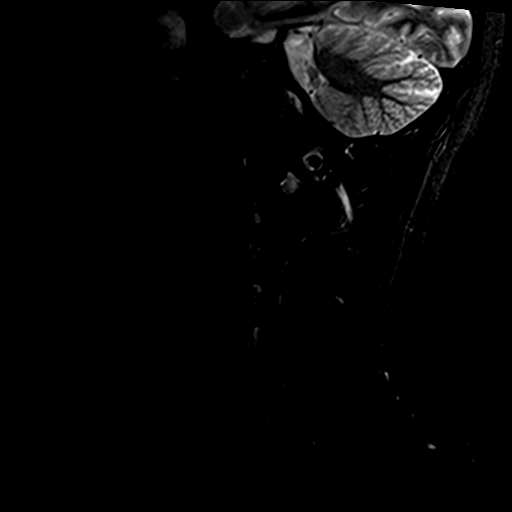
[im 5/13]
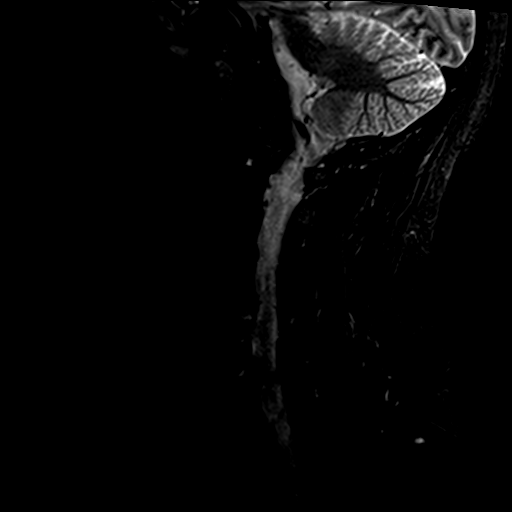
[im 8/13]
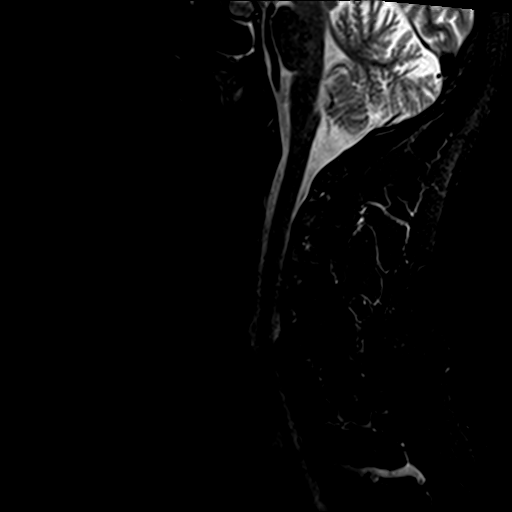
[im 10/13]
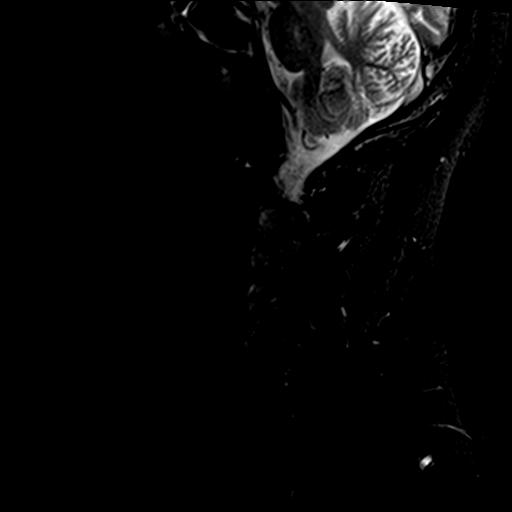
[im 13/13]
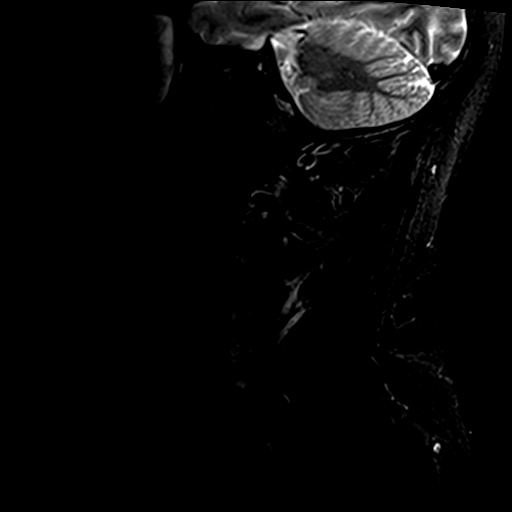

[Series 4: T1 · sagittal · 3.0mm · 0.41mm/px · 6 of 13 slices shown]
[im 1/13]
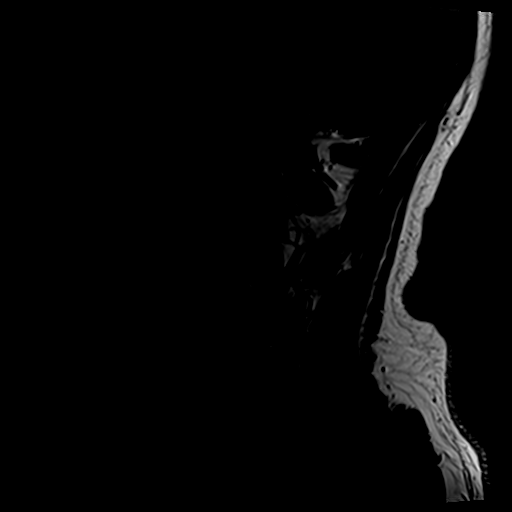
[im 3/13]
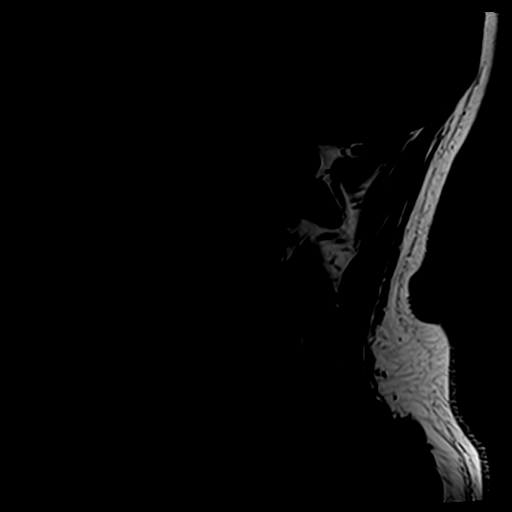
[im 5/13]
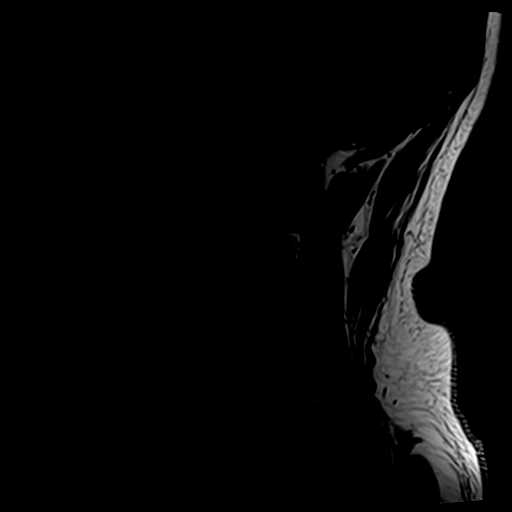
[im 8/13]
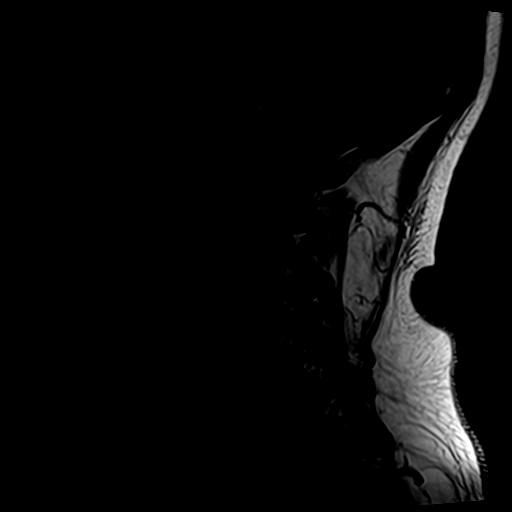
[im 10/13]
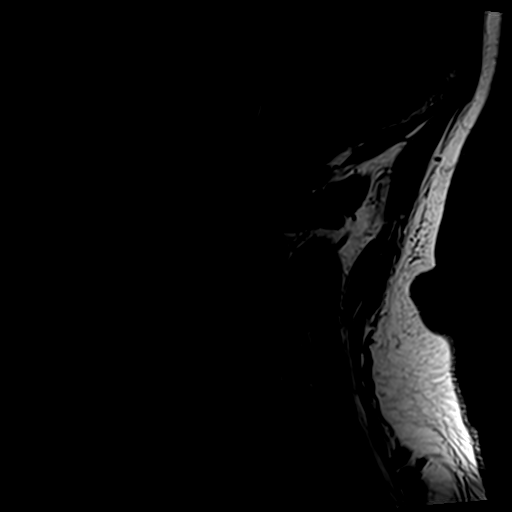
[im 13/13]
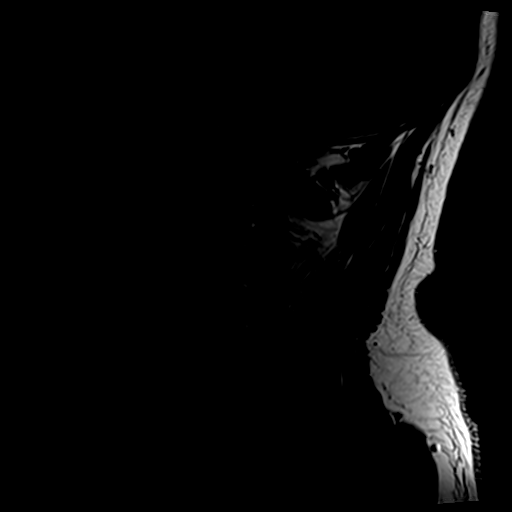

[Series 5: T2 · sagittal · 3.0mm · 0.66mm/px · 6 of 13 slices shown (1 of 2)]
[im 1/13]
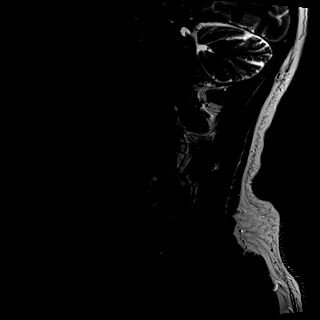
[im 3/13]
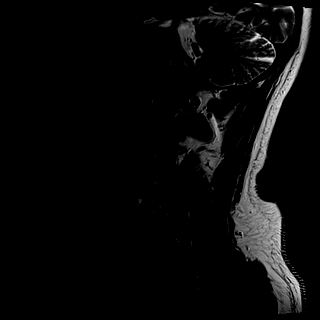
[im 5/13]
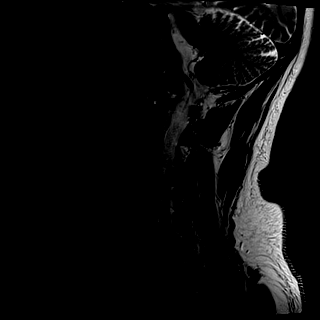
[im 8/13]
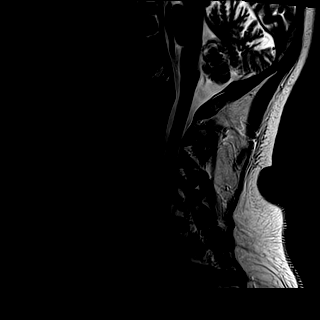
[im 10/13]
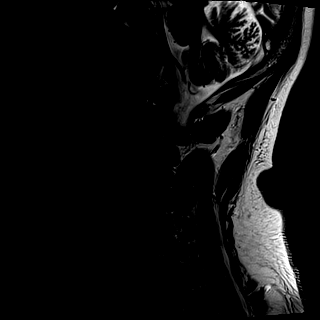
[im 13/13]
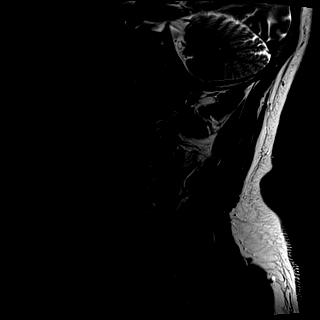

[Series 6: GRE · axial · 3.0mm · 0.35mm/px · 1 of 32 slices shown]
[im 1/32]
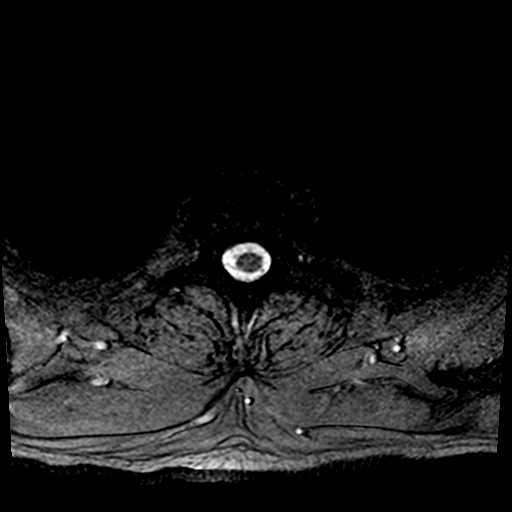

[Series 7: T2 · axial · 3.0mm · 0.70mm/px · z∈[-109,+5]mm · 9 of 32 slices shown (2 of 2)]
[im 1/32]
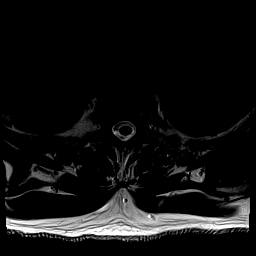
[im 5/32]
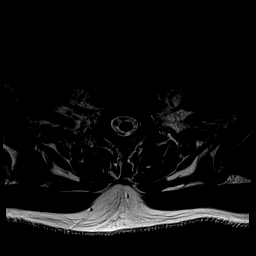
[im 9/32]
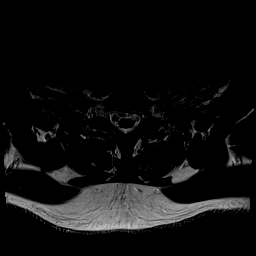
[im 14/32]
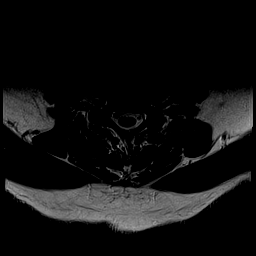
[im 16/32]
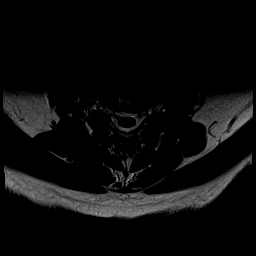
[im 18/32]
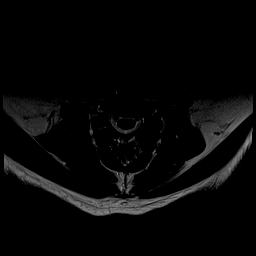
[im 23/32]
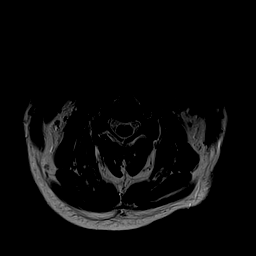
[im 27/32]
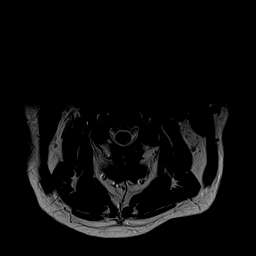
[im 32/32]
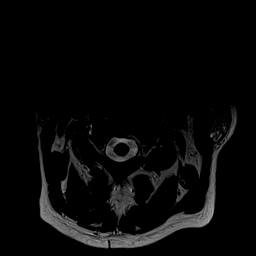

[28 of 48 positions shown; findings below may reference images not displayed]

FINDINGS: Alignment: Maintained.

Vertebrae: No fracture or worrisome lesion. Solid C5-6 ACDF is
identified.

Cord: Cervical cord signal is normal.

Posterior Fossa, vertebral arteries, paraspinal tissues: Patchy T2
hyperintensity in the pons is most consistent with chronic
microvascular ischemic change.

Disc levels:

C2-3: Facet degenerative disease on the left causes mild left
foraminal narrowing. Otherwise negative.

C3-4: Mild facet degenerative change bilaterally. Minimal disc
bulge. The central canal and foramina are open.

C4-5: Shallow broad-based central protrusion narrows but does not
efface the ventral thecal sac. The central canal and foramina appear
open.

C5-6: Status post discectomy and fusion. The central canal and
foramina are widely patent.

C6-7: The patient has a disc osteophyte complex eccentric to the
left and left much worse than right uncovertebral disease. The
ventral thecal sac is effaced. Severe left and moderate to
moderately severe right foraminal narrowing is present.

C7-T1:  Negative.

T1-2: Disc bulge without central canal stenosis. Mild to moderate
foraminal narrowing appears worse on the right.
IMPRESSION: Spondylosis appearing worst at C6-7 where a disc bulge effaces the
ventral thecal sac and uncovertebral disease causes severe left and
moderate to moderately severe right foraminal narrowing.

Shallow broad-based central protrusion at C4-5 effaces the ventral
thecal sac but the central canal and foramina are open.

Mild left foraminal narrowing C2-3 due to facet arthropathy.

Solid C5-6 fusion. The central canal and foramina are widely patent.

## 2020-11-20 ENCOUNTER — Other Ambulatory Visit: Payer: Self-pay | Admitting: Urology

## 2022-07-17 ENCOUNTER — Ambulatory Visit: Payer: BC Managed Care – PPO | Admitting: Urology

## 2022-07-17 ENCOUNTER — Encounter: Payer: Self-pay | Admitting: Urology

## 2022-07-17 VITALS — BP 131/68 | HR 75

## 2022-07-17 DIAGNOSIS — R3129 Other microscopic hematuria: Secondary | ICD-10-CM | POA: Diagnosis not present

## 2022-07-17 DIAGNOSIS — N200 Calculus of kidney: Secondary | ICD-10-CM

## 2022-07-17 LAB — MICROSCOPIC EXAMINATION
Bacteria, UA: NONE SEEN
Epithelial Cells (non renal): NONE SEEN /hpf (ref 0–10)

## 2022-07-17 LAB — URINALYSIS, ROUTINE W REFLEX MICROSCOPIC
Bilirubin, UA: NEGATIVE
Ketones, UA: NEGATIVE
Leukocytes,UA: NEGATIVE
Nitrite, UA: NEGATIVE
Specific Gravity, UA: 1.025 (ref 1.005–1.030)
Urobilinogen, Ur: 0.2 mg/dL (ref 0.2–1.0)
pH, UA: 5 (ref 5.0–7.5)

## 2022-07-17 NOTE — Patient Instructions (Signed)

## 2022-07-17 NOTE — H&P (View-Only) (Signed)
 07/17/2022 10:51 AM   Ethan Green 12/28/1965 [id number]  Referring provider: Wilson, Fred H, MD [address] [place],  [place] [address]  Gross hematuria   HPI: Mr Ethan Green is a 56yo here for evaluation of microhematuria, nephrolithiasis and left renal mass. 2 weeks ago he developed mild right flank pain and right testicular pain, He then had gross hematuria for 3-4 voids which then cleared. IPSS 3 QOL 0. Nocturia 1x 3-4 times per week. He has a 30pk year smoking history. He has not had a stone event in 3 years.  His records from [facility] are as follows:  I have kidney stones. HPI: Ethan Green is a 56 year-old male established patient who is here for renal calculi.??The problem is on the left side. He first stated noticing pain on approximately 02/22/2019. This is not his first kidney stone. His first stone was approximately 02/11/1993. He has had more than 5 stones prior to getting this one. ??02/25/2019: 3 days ago he devloped sharp, intermittent, moderate nonradiaiting left flank pain. He has associated penile pain with urinary urgency, frequency, and dysuria. UA shows 3-10 RBCs/hpf. He underwent CT on 5/12 which showed a 6mm left UVJ calculus. He is currently on flomax 0.4mg daily. ??04/01/2019: He has since passed his left ureteral calculus. He has right lower pole calculi on KUB. no flank pain ??11/12/2019: no stone events since last visit. KUb and renal US shows stable 5-6mm right mid pole calculus ??CC: I have a renal mass. HPI: The problem is on the left side. His problem was diagnosed 03/30/2019. He has had no symptoms. He had the following x-rays done: CT Scan. He has not had kidney surgery. ??He has not seen blood in his urine. He does have a good appetite. BOWEL HABITS: his bowels are moving normally. He is not having pain in new locations. He has not recently had unwanted weight loss. ??11/12/2019: Renal US shows 1cm stable left mid pole cystic lesion    PMH: Past Medical History:   Diagnosis Date   Anxiety    Borderline diabetes    Hypertension     Surgical History: Past Surgical History:  Procedure Laterality Date   ANTERIOR CERVICAL DECOMP/DISCECTOMY FUSION N/A 10/29/2017   Procedure: REMOVAL C5-6 PLATE, C6-7 ANTERIOR CERVICAL DISCECTOMY AND FUSION, ALLOGRAFT, AND PLATE;  Surgeon: Yates, Mark C, MD;  Location: [facility] OR;  Service: Orthopedics;  Laterality: N/A;   CERVICAL SPINE SURGERY     2005   HIATAL HERNIA REPAIR     1990   NASAL SINUS SURGERY     2002    Home Medications:  Allergies as of 07/17/2022       Reactions   Amoxicillin Itching   Has patient had a PCN reaction causing immediate rash, facial/tongue/throat swelling, SOB or lightheadedness with hypotension: Unknown Has patient had a PCN reaction causing severe rash involving mucus membranes or skin necrosis: Unknown Has patient had a PCN reaction that required hospitalization: No Has patient had a PCN reaction occurring within the last 10 years: No If all of the above answers are "NO", then may proceed with Cephalosporin use.   Pravastatin    UNSPECIFIED REACTION         Medication List        Accurate as of July 17, 2022 10:51 AM. If you have any questions, ask your nurse or doctor.          cyclobenzaprine 10 MG tablet Commonly known as: FLEXERIL Take 1 tablet (10   mg total) by mouth at bedtime.   fluticasone 50 MCG/ACT nasal spray Commonly known as: FLONASE Place into both nostrils daily.   GOODY HEADACHE PO Take 2 packets by mouth daily.   hydrocortisone cream 0.5 % Apply 1 application topically 2 (two) times a week.   lisinopril 20 MG tablet Commonly known as: ZESTRIL TAKE 1 TABLET EVERY DAY FOR HYPERTENSION   methocarbamol 500 MG tablet Commonly known as: ROBAXIN Take 1 tablet (500 mg total) by mouth every 6 (six) hours as needed for muscle spasms.   PARoxetine 10 MG tablet Commonly known as: PAXIL TAKE 1 TABLET DAILY TO CONTROL ANXIETY   rosuvastatin 5 MG  tablet Commonly known as: CRESTOR TAKE 1 TABLET DAILY TO CONTROL CHOLESTEROL   tamsulosin 0.4 MG Caps capsule Commonly known as: FLOMAX TAKE 1 CAPSULE BY MOUTH EVERYDAY AT BEDTIME   traMADol 50 MG tablet Commonly known as: ULTRAM Take 1 tablet (50 mg total) by mouth 2 (two) times daily as needed for moderate pain.        Allergies:  Allergies  Allergen Reactions   Amoxicillin Itching    Has patient had a PCN reaction causing immediate rash, facial/tongue/throat swelling, SOB or lightheadedness with hypotension: Unknown Has patient had a PCN reaction causing severe rash involving mucus membranes or skin necrosis: Unknown Has patient had a PCN reaction that required hospitalization: No Has patient had a PCN reaction occurring within the last 10 years: No If all of the above answers are "NO", then may proceed with Cephalosporin use.    Pravastatin     UNSPECIFIED REACTION     Family History: No family history on file.  Social History:  reports that he has been smoking cigarettes. He has been smoking an average of 1.5 packs per day. He has never used smokeless tobacco. He reports current alcohol use. He reports current drug use. Drug: Marijuana.  ROS: All other review of systems were reviewed and are negative except what is noted above in HPI  Physical Exam: There were no vitals taken for this visit.  Constitutional:  Alert and oriented, No acute distress. HEENT:  AT, moist mucus membranes.  Trachea midline, no masses. Cardiovascular: No clubbing, cyanosis, or edema. Respiratory: Normal respiratory effort, no increased work of breathing. GI: Abdomen is soft, nontender, nondistended, no abdominal masses GU: No CVA tenderness.  Lymph: No cervical or inguinal lymphadenopathy. Skin: No rashes, bruises or suspicious lesions. Neurologic: Grossly intact, no focal deficits, moving all 4 extremities. Psychiatric: Normal mood and affect.  Laboratory Data: Lab Results   Component Value Date   WBC 9.4 10/27/2017   HGB 16.4 10/27/2017   HCT 46.6 10/27/2017   MCV 91.0 10/27/2017   PLT 213 10/27/2017    Lab Results  Component Value Date   CREATININE 0.97 10/27/2017    No results found for: "PSA"  No results found for: "TESTOSTERONE"  Lab Results  Component Value Date   HGBA1C 7.1 (H) 10/27/2017    Urinalysis    Component Value Date/Time   COLORURINE YELLOW 10/27/2017 1540   APPEARANCEUR CLEAR 10/27/2017 1540   LABSPEC 1.017 10/27/2017 1540   PHURINE 5.0 10/27/2017 1540   GLUCOSEU 50 (A) 10/27/2017 1540   HGBUR SMALL (A) 10/27/2017 1540   BILIRUBINUR NEGATIVE 10/27/2017 [place] 10/27/2017 1540   PROTEINUR NEGATIVE 10/27/2017 1540   NITRITE NEGATIVE 10/27/2017 1540   LEUKOCYTESUR NEGATIVE 10/27/2017 1540    Lab Results  Component Value Date   BACTERIA NONE SEEN 10/27/2017  Pertinent Imaging:  No results found for this or any previous visit.  No results found for this or any previous visit.  No results found for this or any previous visit.  No results found for this or any previous visit.  No results found for this or any previous visit.  No valid procedures specified. No results found for this or any previous visit.  No results found for this or any previous visit.   Assessment & Plan:    1. Nephrolithiasis and history of left renal mass -CT hematuria protocol  2. Microscopic hematuria CT hematuria   No follow-ups on file.  Kaitlynd Phillips, MD  [facility]   

## 2022-07-17 NOTE — Progress Notes (Signed)
07/17/2022 10:51 AM   Ethan Green 1965-12-24 [id number]  Referring provider: Barbie Banner, MD [address] [place],  [place] [address]  Gross hematuria   HPI: Ethan Green is a 56yo here for evaluation of microhematuria, nephrolithiasis and left renal mass. 2 weeks ago he developed mild right flank pain and right testicular pain, He then had gross hematuria for 3-4 voids which then cleared. IPSS 3 QOL 0. Nocturia 1x 3-4 times per week. He has a 30pk year smoking history. He has not had a stone event in 3 years.  His records from [facility] are as follows:  I have kidney stones. HPI: Ethan Green is a 56 year-old male established patient who is here for renal calculi.??The problem is on the left side. He first stated noticing pain on approximately 02/22/2019. This is not his first kidney stone. His first stone was approximately 02/11/1993. He has had more than 5 stones prior to getting this one. ??02/25/2019: 3 days ago he devloped sharp, intermittent, moderate nonradiaiting left flank pain. He has associated penile pain with urinary urgency, frequency, and dysuria. UA shows 3-10 RBCs/hpf. He underwent CT on 5/12 which showed a 84mm left UVJ calculus. He is currently on flomax 0.4mg  daily. ??04/01/2019: He has since passed his left ureteral calculus. He has right lower pole calculi on KUB. no flank pain ??11/12/2019: no stone events since last visit. KUb and renal US shows stable 5-74mm right mid pole calculus ??CC: I have a renal mass. HPI: The problem is on the left side. His problem was diagnosed 03/30/2019. He has had no symptoms. He had the following x-rays done: CT Scan. He has not had kidney surgery. ??He has not seen blood in his urine. He does have a good appetite. BOWEL HABITS: his bowels are moving normally. He is not having pain in new locations. He has not recently had unwanted weight loss. ??11/12/2019: Renal US shows 1cm stable left mid pole cystic lesion    PMH: Past Medical History:   Diagnosis Date   Anxiety    Borderline diabetes    Hypertension     Surgical History: Past Surgical History:  Procedure Laterality Date   ANTERIOR CERVICAL DECOMP/DISCECTOMY FUSION N/A 10/29/2017   Procedure: REMOVAL C5-6 PLATE, Z1-6 ANTERIOR CERVICAL DISCECTOMY AND FUSION, ALLOGRAFT, AND PLATE;  Surgeon: Eldred Manges, MD;  Location: [facility];  Service: Orthopedics;  Laterality: N/A;   CERVICAL SPINE SURGERY     2005   HIATAL HERNIA REPAIR     1990   NASAL SINUS SURGERY     2002    Home Medications:  Allergies as of 07/17/2022       Reactions   Amoxicillin Itching   Has patient had a PCN reaction causing immediate rash, facial/tongue/throat swelling, SOB or lightheadedness with hypotension: Unknown Has patient had a PCN reaction causing severe rash involving mucus membranes or skin necrosis: Unknown Has patient had a PCN reaction that required hospitalization: No Has patient had a PCN reaction occurring within the last 10 years: No If all of the above answers are "NO", then may proceed with Cephalosporin use.   Pravastatin    UNSPECIFIED REACTION         Medication List        Accurate as of July 17, 2022 10:51 AM. If you have any questions, ask your nurse or doctor.          cyclobenzaprine 10 MG tablet Commonly known as: FLEXERIL Take 1 tablet (10  mg total) by mouth at bedtime.   fluticasone 50 MCG/ACT nasal spray Commonly known as: FLONASE Place into both nostrils daily.   GOODY HEADACHE PO Take 2 packets by mouth daily.   hydrocortisone cream 0.5 % Apply 1 application topically 2 (two) times a week.   lisinopril 20 MG tablet Commonly known as: ZESTRIL TAKE 1 TABLET EVERY DAY FOR HYPERTENSION   methocarbamol 500 MG tablet Commonly known as: ROBAXIN Take 1 tablet (500 mg total) by mouth every 6 (six) hours as needed for muscle spasms.   PARoxetine 10 MG tablet Commonly known as: PAXIL TAKE 1 TABLET DAILY TO CONTROL ANXIETY   rosuvastatin 5 MG  tablet Commonly known as: CRESTOR TAKE 1 TABLET DAILY TO CONTROL CHOLESTEROL   tamsulosin 0.4 MG Caps capsule Commonly known as: FLOMAX TAKE 1 CAPSULE BY MOUTH EVERYDAY AT BEDTIME   traMADol 50 MG tablet Commonly known as: ULTRAM Take 1 tablet (50 mg total) by mouth 2 (two) times daily as needed for moderate pain.        Allergies:  Allergies  Allergen Reactions   Amoxicillin Itching    Has patient had a PCN reaction causing immediate rash, facial/tongue/throat swelling, SOB or lightheadedness with hypotension: Unknown Has patient had a PCN reaction causing severe rash involving mucus membranes or skin necrosis: Unknown Has patient had a PCN reaction that required hospitalization: No Has patient had a PCN reaction occurring within the last 10 years: No If all of the above answers are "NO", then may proceed with Cephalosporin use.    Pravastatin     UNSPECIFIED REACTION     Family History: No family history on file.  Social History:  reports that he has been smoking cigarettes. He has been smoking an average of 1.5 packs per day. He has never used smokeless tobacco. He reports current alcohol use. He reports current drug use. Drug: Marijuana.  ROS: All other review of systems were reviewed and are negative except what is noted above in HPI  Physical Exam: There were no vitals taken for this visit.  Constitutional:  Alert and oriented, No acute distress. HEENT:  AT, moist mucus membranes.  Trachea midline, no masses. Cardiovascular: No clubbing, cyanosis, or edema. Respiratory: Normal respiratory effort, no increased work of breathing. GI: Abdomen is soft, nontender, nondistended, no abdominal masses GU: No CVA tenderness.  Lymph: No cervical or inguinal lymphadenopathy. Skin: No rashes, bruises or suspicious lesions. Neurologic: Grossly intact, no focal deficits, moving all 4 extremities. Psychiatric: Normal mood and affect.  Laboratory Data: Lab Results   Component Value Date   WBC 9.4 10/27/2017   HGB 16.4 10/27/2017   HCT 46.6 10/27/2017   MCV 91.0 10/27/2017   PLT 213 10/27/2017    Lab Results  Component Value Date   CREATININE 0.97 10/27/2017    No results found for: "PSA"  No results found for: "TESTOSTERONE"  Lab Results  Component Value Date   HGBA1C 7.1 (H) 10/27/2017    Urinalysis    Component Value Date/Time   COLORURINE YELLOW 10/27/2017 1540   APPEARANCEUR CLEAR 10/27/2017 1540   LABSPEC 1.017 10/27/2017 1540   PHURINE 5.0 10/27/2017 1540   GLUCOSEU 50 (A) 10/27/2017 1540   HGBUR SMALL (A) 10/27/2017 1540   BILIRUBINUR NEGATIVE 10/27/2017 [place] 10/27/2017 1540   PROTEINUR NEGATIVE 10/27/2017 1540   NITRITE NEGATIVE 10/27/2017 1540   LEUKOCYTESUR NEGATIVE 10/27/2017 1540    Lab Results  Component Value Date   BACTERIA NONE SEEN 10/27/2017  Pertinent Imaging:  No results found for this or any previous visit.  No results found for this or any previous visit.  No results found for this or any previous visit.  No results found for this or any previous visit.  No results found for this or any previous visit.  No valid procedures specified. No results found for this or any previous visit.  No results found for this or any previous visit.   Assessment & Plan:    1. Nephrolithiasis and history of left renal mass -CT hematuria protocol  2. Microscopic hematuria CT hematuria   No follow-ups on file.  Wilkie Aye, MD  [facility] [place]

## 2022-08-03 ENCOUNTER — Ambulatory Visit (HOSPITAL_COMMUNITY)
Admission: RE | Admit: 2022-08-03 | Discharge: 2022-08-03 | Disposition: A | Discharge status: Home / Self Care | Payer: BC Managed Care – PPO | Source: Ambulatory Visit | Attending: Urology | Admitting: Urology

## 2022-08-03 DIAGNOSIS — R3129 Other microscopic hematuria: Secondary | ICD-10-CM | POA: Insufficient documentation

## 2022-08-03 MED ORDER — IOHEXOL 300 MG/ML  SOLN
100.0000 mL | Freq: Once | INTRAMUSCULAR | Status: AC | PRN
  Administered 2022-08-03: 125 mL via INTRAVENOUS

## 2022-08-07 ENCOUNTER — Encounter: Payer: Self-pay | Admitting: Urology

## 2022-08-07 ENCOUNTER — Other Ambulatory Visit: Payer: Self-pay

## 2022-08-07 ENCOUNTER — Ambulatory Visit (INDEPENDENT_AMBULATORY_CARE_PROVIDER_SITE_OTHER): Payer: BC Managed Care – PPO | Admitting: Urology

## 2022-08-07 VITALS — BP 158/76 | HR 72

## 2022-08-07 DIAGNOSIS — N200 Calculus of kidney: Secondary | ICD-10-CM

## 2022-08-07 DIAGNOSIS — R3129 Other microscopic hematuria: Secondary | ICD-10-CM | POA: Diagnosis not present

## 2022-08-07 LAB — MICROSCOPIC EXAMINATION
Bacteria, UA: NONE SEEN
RBC, Urine: >30 /hpf — AB (ref 0–2)

## 2022-08-07 LAB — URINALYSIS, ROUTINE W REFLEX MICROSCOPIC
Bilirubin, UA: NEGATIVE
Leukocytes,UA: NEGATIVE
Nitrite, UA: NEGATIVE
Specific Gravity, UA: 1.02 (ref 1.005–1.030)
Urobilinogen, Ur: 1 mg/dL (ref 0.2–1.0)
pH, UA: 5.5 (ref 5.0–7.5)

## 2022-08-07 NOTE — Progress Notes (Signed)
08/07/2022 9:23 AM   Ethan Green 18-Oct-1965 [id number]  Referring provider: Barbie Banner, MD [address] [place],  [place] [address]  Followup microhematuria   HPI: Ethan Green is a 56yo here for followup for microhematuria. He underwent CT hematuria which showed a 32mm right renal calculus. He has intermittent right flank pain and testicular pain. He denies any worsening LUTS. No recent gross hematuria   PMH: Past Medical History:  Diagnosis Date   Anxiety    Borderline diabetes    Hypertension     Surgical History: Past Surgical History:  Procedure Laterality Date   ANTERIOR CERVICAL DECOMP/DISCECTOMY FUSION N/A 10/29/2017   Procedure: REMOVAL C5-6 PLATE, W4-1 ANTERIOR CERVICAL DISCECTOMY AND FUSION, ALLOGRAFT, AND PLATE;  Surgeon: Eldred Manges, MD;  Location: [facility] OR;  Service: Orthopedics;  Laterality: N/A;   CERVICAL SPINE SURGERY     2005   HIATAL HERNIA REPAIR     1990   NASAL SINUS SURGERY     2002    Home Medications:  Allergies as of 08/07/2022       Reactions   Amoxicillin Itching   Has patient had a PCN reaction causing immediate rash, facial/tongue/throat swelling, SOB or lightheadedness with hypotension: Unknown Has patient had a PCN reaction causing severe rash involving mucus membranes or skin necrosis: Unknown Has patient had a PCN reaction that required hospitalization: No Has patient had a PCN reaction occurring within the last 10 years: No If all of the above answers are "NO", then may proceed with Cephalosporin use.   Pravastatin    UNSPECIFIED REACTION    Tamsulosin Other (See Comments)   Retrograde ejaculation.        Medication List        Accurate as of August 07, 2022  9:23 AM. If you have any questions, ask your nurse or doctor.          cyclobenzaprine 10 MG tablet Commonly known as: FLEXERIL Take 1 tablet (10 mg total) by mouth at bedtime.   fluticasone 50 MCG/ACT nasal spray Commonly known as: FLONASE Place  into both nostrils daily.   GOODY HEADACHE PO Take 2 packets by mouth daily.   hydrocortisone cream 0.5 % Apply 1 application topically 2 (two) times a week.   lisinopril 20 MG tablet Commonly known as: ZESTRIL TAKE 1 TABLET EVERY DAY FOR HYPERTENSION   methocarbamol 500 MG tablet Commonly known as: ROBAXIN Take 1 tablet (500 mg total) by mouth every 6 (six) hours as needed for muscle spasms.   PARoxetine 10 MG tablet Commonly known as: PAXIL TAKE 1 TABLET DAILY TO CONTROL ANXIETY   rosuvastatin 5 MG tablet Commonly known as: CRESTOR   tamsulosin 0.4 MG Caps capsule Commonly known as: FLOMAX TAKE 1 CAPSULE BY MOUTH EVERYDAY AT BEDTIME   traMADol 50 MG tablet Commonly known as: ULTRAM Take 1 tablet (50 mg total) by mouth 2 (two) times daily as needed for moderate pain.        Allergies:  Allergies  Allergen Reactions   Amoxicillin Itching    Has patient had a PCN reaction causing immediate rash, facial/tongue/throat swelling, SOB or lightheadedness with hypotension: Unknown Has patient had a PCN reaction causing severe rash involving mucus membranes or skin necrosis: Unknown Has patient had a PCN reaction that required hospitalization: No Has patient had a PCN reaction occurring within the last 10 years: No If all of the above answers are "NO", then may proceed with Cephalosporin use.  Pravastatin     UNSPECIFIED REACTION    Tamsulosin Other (See Comments)    Retrograde ejaculation.    Family History: No family history on file.  Social History:  reports that he has been smoking cigarettes. He has been smoking an average of 1.5 packs per day. He has never used smokeless tobacco. He reports current alcohol use. He reports current drug use. Drug: Marijuana.  ROS: All other review of systems were reviewed and are negative except what is noted above in HPI  Physical Exam: BP (!) 158/76   Pulse 72   Constitutional:  Alert and oriented, No acute  distress. HEENT: [place] AT, moist mucus membranes.  Trachea midline, no masses. Cardiovascular: No clubbing, cyanosis, or edema. Respiratory: Normal respiratory effort, no increased work of breathing. GI: Abdomen is soft, nontender, nondistended, no abdominal masses GU: No CVA tenderness.  Lymph: No cervical or inguinal lymphadenopathy. Skin: No rashes, bruises or suspicious lesions. Neurologic: Grossly intact, no focal deficits, moving all 4 extremities. Psychiatric: Normal mood and affect.  Laboratory Data: Lab Results  Component Value Date   WBC 9.4 10/27/2017   HGB 16.4 10/27/2017   HCT 46.6 10/27/2017   MCV 91.0 10/27/2017   PLT 213 10/27/2017    Lab Results  Component Value Date   CREATININE 0.97 10/27/2017    No results found for: "PSA"  No results found for: "TESTOSTERONE"  Lab Results  Component Value Date   HGBA1C 7.1 (H) 10/27/2017    Urinalysis    Component Value Date/Time   COLORURINE YELLOW 10/27/2017 1540   APPEARANCEUR Cloudy (A) 07/17/2022 1126   LABSPEC 1.017 10/27/2017 1540   PHURINE 5.0 10/27/2017 1540   GLUCOSEU 1+ (A) 07/17/2022 1126   HGBUR SMALL (A) 10/27/2017 1540   BILIRUBINUR Negative 07/17/2022 1126   KETONESUR NEGATIVE 10/27/2017 1540   PROTEINUR 1+ (A) 07/17/2022 1126   PROTEINUR NEGATIVE 10/27/2017 1540   NITRITE Negative 07/17/2022 1126   NITRITE NEGATIVE 10/27/2017 1540   LEUKOCYTESUR Negative 07/17/2022 1126    Lab Results  Component Value Date   LABMICR See below: 07/17/2022   WBCUA 0-5 07/17/2022   LABEPIT None seen 07/17/2022   MUCUS Present (A) 07/17/2022   BACTERIA None seen 07/17/2022    Pertinent Imaging: CT hematuria 10/21: Images reviewed and discussed with the patient  No results found for this or any previous visit.  No results found for this or any previous visit.  No results found for this or any previous visit.  No results found for this or any previous visit.  No results found for this or any previous  visit.  No valid procedures specified. Results for orders placed during the hospital encounter of 08/03/22  CT HEMATURIA WORKUP  Narrative CLINICAL DATA:  Hematuria.  Nephrolithiasis.  EXAM: CT ABDOMEN AND PELVIS WITHOUT AND WITH CONTRAST  TECHNIQUE: Multidetector CT imaging of the abdomen and pelvis was performed following the standard protocol before and following the bolus administration of intravenous contrast.  RADIATION DOSE REDUCTION: This exam was performed according to the departmental dose-optimization program which includes automated exposure control, adjustment of the mA and/or kV according to patient size and/or use of iterative reconstruction technique.  CONTRAST:  OMNIPAQUE IOHEXOL 300 MG/ML  SOLN  COMPARISON:  02/23/2019  FINDINGS: Lower Chest: 7 mm pulmonary nodule again seen in lateral left lower lobe, consistent with benign etiology.  Hepatobiliary: No hepatic masses identified. Tiny sub-cm cyst noted in liver dome. Mild diffuse hepatic steatosis again seen. Gallbladder is unremarkable.  No evidence of biliary ductal dilatation.  Pancreas:  No mass or inflammatory changes.  Spleen: Within normal limits in size and appearance.  Adrenals/Urinary Tract: No adrenal masses identified. Few renal calculi are again seen bilaterally, largest in the right kidney measuring 9 mm. No evidence of ureteral calculi or hydronephrosis. Tiny benign subcapsular left renal cyst again seen (no followup imaging is recommended). No suspicious renal masses identified. No masses seen involving the collecting systems, ureters, or bladder.  Stomach/Bowel: Prior Nissen fundoplication again noted. No evidence of obstruction, inflammatory process or abnormal fluid collections. Normal appendix visualized. Diverticulosis is seen mainly involving the sigmoid colon, however there is no evidence of diverticulitis.  Vascular/Lymphatic: No pathologically enlarged lymph nodes. No  acute vascular findings. Aortic atherosclerotic calcification incidentally noted.  Reproductive:  No mass or other significant abnormality.  Other:  Stable small right inguinal hernia, which contains only fat.  Musculoskeletal:  No suspicious bone lesions identified.  IMPRESSION: Bilateral nephrolithiasis. No evidence of ureteral calculi, hydronephrosis, or other acute findings.  No radiographic evidence of urinary tract neoplasm.  Mild hepatic steatosis.  Colonic diverticulosis, without radiographic evidence of diverticulitis.  Stable small right inguinal hernia, which contains only fat.  Aortic Atherosclerosis (ICD10-I70.0).   Electronically Signed By: Marlaine Hind M.D. On: 08/04/2022 17:15  No results found for this or any previous visit.   Assessment & Plan:    1. Nephrolithiasis -We discussed the management of kidney stones. These options include observation, ureteroscopy, shockwave lithotripsy (ESWL) and percutaneous nephrolithotomy (PCNL). We discussed which options are relevant to the patient's stone(s). We discussed the natural history of kidney stones as well as the complications of untreated stones and the impact on quality of life without treatment as well as with each of the above listed treatments. We also discussed the efficacy of each treatment in its ability to clear the stone burden. With any of these management options I discussed the signs and symptoms of infection and the need for emergent treatment should these be experienced. For each option we discussed the ability of each procedure to clear the patient of their stone burden.   For observation I described the risks which include but are not limited to silent renal damage, life-threatening infection, need for emergent surgery, failure to pass stone and pain.   For ureteroscopy I described the risks which include bleeding, infection, damage to contiguous structures, positioning injury, ureteral stricture,  ureteral avulsion, ureteral injury, need for prolonged ureteral stent, inability to perform ureteroscopy, need for an interval procedure, inability to clear stone burden, stent discomfort/pain, heart attack, stroke, pulmonary embolus and the inherent risks with general anesthesia.   For shockwave lithotripsy I described the risks which include arrhythmia, kidney contusion, kidney hemorrhage, need for transfusion, pain, inability to adequately break up stone, inability to pass stone fragments, Steinstrasse, infection associated with obstructing stones, need for alternate surgical procedure, need for repeat shockwave lithotripsy, MI, CVA, PE and the inherent risks with anesthesia/conscious sedation.   For PCNL I described the risks including positioning injury, pneumothorax, hydrothorax, need for chest tube, inability to clear stone burden, renal laceration, arterial venous fistula or malformation, need for embolization of kidney, loss of kidney or renal function, need for repeat procedure, need for prolonged nephrostomy tube, ureteral avulsion, MI, CVA, PE and the inherent risks of general anesthesia.   - The patient would like to proceed with right ESWL - Urinalysis, Routine w reflex microscopic   No follow-ups on file.  Nicolette Bang, MD  [facility]

## 2022-08-07 NOTE — Patient Instructions (Signed)
ESWL for Kidney Stones  Extracorporeal shock wave lithotripsy (ESWL) is a treatment that can help break up kidney stones that are too large to pass on their own.  This is a nonsurgical procedure that breaks up a kidney stone with shock waves. These shock waves pass through your body and focus on the kidney stone. They cause the kidney stone to break into smaller pieces (fragments) while it is still in the urinary tract. The fragments of stone can pass more easily out of your body in the urine. Tell a health care provider about: Any allergies you have. All medicines you are taking, including vitamins, herbs, eye drops, creams, and over-the-counter medicines. Any problems you or family members have had with anesthetic medicines. Any bleeding problems you have. Any surgeries you have had. Any medical conditions you have. Whether you are pregnant or may be pregnant. What are the risks? Your health care provider will talk with you about risks. These may include: Infection. Bleeding from the kidney. Bruising of the kidney or skin. Scarring of the kidney. This can lead to: Increased blood pressure. Poor kidney function. Return (recurrence) of kidney stones. Damage to other structures or organs. This may include the liver, colon, spleen, or pancreas. Blockage (obstruction) of the tube that carries urine from the kidney to the bladder (ureter). Failure of the kidney stone to break into fragments. What happens before the procedure? When to stop eating and drinking Follow instructions from your health care provider about what you may eat and drink. These may include: 8 hours before your procedure Stop eating most foods. Do not eat meat, fried foods, or fatty foods. Eat only light foods, such as toast or crackers. All liquids are okay except energy drinks and alcohol. 6 hours before your procedure Stop eating. Drink only clear liquids, such as water, clear fruit juice, black coffee, plain tea,  and sports drinks. Do not drink energy drinks or alcohol. 2 hours before your procedure Stop drinking all liquids. You may be allowed to take medicines with small sips of water. If you do not follow your health care provider's instructions, your procedure may be delayed or canceled. Medicines Ask your health care provider about: Changing or stopping your regular medicines. These include any diabetes medicines or blood thinners you take. Taking medicines such as aspirin and ibuprofen. These medicines can thin your blood. Do not take them unless your health care provider tells you to. Taking over-the-counter medicines, vitamins, herbs, and supplements. Tests You may have tests, such as: Blood tests. Urine tests. Imaging tests. This may include a CT scan. Surgery safety Ask your health care provider: How your surgery site will be marked. What steps will be taken to help prevent infection. These steps may include: Washing skin with a soap that kills germs. Receiving antibiotics. General instructions If you will be going home right after the procedure, plan to have a responsible adult: Take you home from the hospital or clinic. You will not be allowed to drive. Care for you for the time you are told. What happens during the procedure?  An IV will be inserted into one of your veins. You may be given: A sedative. This helps you relax. Anesthesia. This will: Numb certain areas of your body. Make you fall asleep for surgery. A water-filled cushion may be placed behind your kidney or on your abdomen. In some cases, you may be placed in a tub of lukewarm water. Your body will be positioned in a way that makes it   easier to target the kidney stone. An X-ray or ultrasound exam will be done to locate your stone. Shock waves will be aimed at the stone. If you are awake, you may feel a tapping sensation as the shock waves pass through your body. A small mesh tube (stent) may be placed in your  ureter. This will help keep urine flowing from the kidney if the fragments of the stone have been blocking the ureter. The stent will be removed at a later time by your health care provider. The procedure may vary among health care providers and hospitals. What happens after the procedure? Your blood pressure, heart rate, breathing rate, and blood oxygen level will be monitored until you leave the hospital or clinic. You may have an X-ray after the procedure to see how many of the kidney stones were broken up. This will also show how much of the stone has passed. If there are still large fragments after treatment, you may need to have a second procedure at a later time. This information is not intended to replace advice given to you by your health care provider. Make sure you discuss any questions you have with your health care provider. Document Revised: 01/31/2022 Document Reviewed: 01/31/2022 Elsevier Patient Education  2023 [employer]  

## 2022-08-07 NOTE — Progress Notes (Signed)
I spoke with Ethan Green. We have discussed possible surgery dates and 08/13/2022 was agreed upon by all parties. Patient given information about surgery date, what to expect pre-operatively and post operatively.    We discussed that a pre-op nurse will be calling to set up the pre-op visit that will take place prior to surgery. Informed patient that our office will communicate any additional care to be provided after surgery.    Patients questions or concerns were discussed during our call. Advised to call our office should there be any additional information, questions or concerns that arise. Patient verbalized understanding.

## 2022-08-09 ENCOUNTER — Other Ambulatory Visit: Payer: Self-pay

## 2022-08-09 ENCOUNTER — Encounter (HOSPITAL_COMMUNITY): Payer: Self-pay

## 2022-08-09 ENCOUNTER — Encounter (HOSPITAL_COMMUNITY)
Admission: RE | Admit: 2022-08-09 | Discharge: 2022-08-09 | Disposition: A | Discharge status: Home / Self Care | Payer: BLUE CROSS/BLUE SHIELD | Source: Ambulatory Visit | Attending: Urology | Admitting: Urology

## 2022-08-09 HISTORY — DX: Prediabetes: R73.03

## 2022-08-13 ENCOUNTER — Ambulatory Visit (HOSPITAL_COMMUNITY)
Admission: RE | Admit: 2022-08-13 | Discharge: 2022-08-13 | Disposition: A | Discharge status: Home / Self Care | Payer: BC Managed Care – PPO | Source: Home / Self Care | Attending: Urology | Admitting: Urology

## 2022-08-13 ENCOUNTER — Ambulatory Visit (HOSPITAL_COMMUNITY)
Admission: RE | Admit: 2022-08-13 | Discharge: 2022-08-13 | Disposition: A | Discharge status: Home / Self Care | Payer: BC Managed Care – PPO | Attending: Urology | Admitting: Urology

## 2022-08-13 ENCOUNTER — Encounter (HOSPITAL_COMMUNITY)
Admission: RE | Disposition: A | Discharge status: Home / Self Care | Payer: Self-pay | Source: Home / Self Care | Attending: Urology

## 2022-08-13 DIAGNOSIS — R319 Hematuria, unspecified: Secondary | ICD-10-CM | POA: Insufficient documentation

## 2022-08-13 DIAGNOSIS — E119 Type 2 diabetes mellitus without complications: Secondary | ICD-10-CM | POA: Insufficient documentation

## 2022-08-13 DIAGNOSIS — N200 Calculus of kidney: Secondary | ICD-10-CM

## 2022-08-13 DIAGNOSIS — I10 Essential (primary) hypertension: Secondary | ICD-10-CM | POA: Diagnosis not present

## 2022-08-13 DIAGNOSIS — F17200 Nicotine dependence, unspecified, uncomplicated: Secondary | ICD-10-CM | POA: Diagnosis not present

## 2022-08-13 DIAGNOSIS — M4802 Spinal stenosis, cervical region: Secondary | ICD-10-CM

## 2022-08-13 HISTORY — PX: EXTRACORPOREAL SHOCK WAVE LITHOTRIPSY: SHX1557

## 2022-08-13 LAB — GLUCOSE, CAPILLARY: Glucose-Capillary: 140 mg/dL — ABNORMAL HIGH (ref 70–99)

## 2022-08-13 SURGERY — LITHOTRIPSY, ESWL
Anesthesia: LOCAL | Laterality: Right

## 2022-08-13 MED ORDER — OXYCODONE-ACETAMINOPHEN 5-325 MG PO TABS: 1.0000 | ORAL_TABLET | ORAL | 0 refills | Status: DC | PRN

## 2022-08-13 MED ORDER — SODIUM CHLORIDE 0.9 % IV SOLN: INTRAVENOUS | Status: DC

## 2022-08-13 MED ORDER — ALFUZOSIN HCL ER 10 MG PO TB24: 10.0000 mg | ORAL_TABLET | Freq: Every day | ORAL | 1 refills | Status: DC

## 2022-08-13 MED ORDER — DIAZEPAM 5 MG PO TABS
ORAL_TABLET | ORAL | Status: AC
  Filled 2022-08-13: qty 2

## 2022-08-13 MED ORDER — DIPHENHYDRAMINE HCL 25 MG PO CAPS
ORAL_CAPSULE | ORAL | Status: AC
  Filled 2022-08-13: qty 1

## 2022-08-13 MED ORDER — DIAZEPAM 5 MG PO TABS
10.0000 mg | ORAL_TABLET | Freq: Once | ORAL | Status: AC
  Administered 2022-08-13: 10 mg via ORAL

## 2022-08-13 MED ORDER — ONDANSETRON HCL 4 MG PO TABS: 4.0000 mg | ORAL_TABLET | Freq: Every day | ORAL | 1 refills | Status: DC | PRN

## 2022-08-13 MED ORDER — DIPHENHYDRAMINE HCL 25 MG PO CAPS
25.0000 mg | ORAL_CAPSULE | ORAL | Status: AC
  Administered 2022-08-13: 25 mg via ORAL

## 2022-08-13 NOTE — Interval H&P Note (Signed)
History and Physical Interval Note:  08/13/2022 7:54 AM  Ethan Green  has presented today for surgery, with the diagnosis of right renal calculus.  The various methods of treatment have been discussed with the patient and family. After consideration of risks, benefits and other options for treatment, the patient has consented to  Procedure(s): EXTRACORPOREAL SHOCK WAVE LITHOTRIPSY (ESWL) (Right) as a surgical intervention.  The patient's history has been reviewed, patient examined, no change in status, stable for surgery.  I have reviewed the patient's chart and labs.  Questions were answered to the patient's satisfaction.     Nicolette Bang

## 2022-08-15 ENCOUNTER — Encounter (HOSPITAL_COMMUNITY): Payer: Self-pay | Admitting: Urology

## 2022-08-26 ENCOUNTER — Other Ambulatory Visit: Payer: Self-pay

## 2022-08-26 DIAGNOSIS — N200 Calculus of kidney: Secondary | ICD-10-CM

## 2022-08-28 ENCOUNTER — Ambulatory Visit (INDEPENDENT_AMBULATORY_CARE_PROVIDER_SITE_OTHER): Payer: BLUE CROSS/BLUE SHIELD | Admitting: Urology

## 2022-08-28 ENCOUNTER — Ambulatory Visit: Payer: BLUE CROSS/BLUE SHIELD | Admitting: Physician Assistant

## 2022-08-28 ENCOUNTER — Ambulatory Visit (HOSPITAL_COMMUNITY)
Admission: RE | Admit: 2022-08-28 | Discharge: 2022-08-28 | Disposition: A | Discharge status: Home / Self Care | Payer: BC Managed Care – PPO | Source: Ambulatory Visit | Attending: Urology | Admitting: Urology

## 2022-08-28 ENCOUNTER — Encounter: Payer: Self-pay | Admitting: Urology

## 2022-08-28 VITALS — BP 170/81 | HR 76

## 2022-08-28 DIAGNOSIS — N200 Calculus of kidney: Secondary | ICD-10-CM | POA: Insufficient documentation

## 2022-08-28 NOTE — Progress Notes (Signed)
08/28/2022 9:01 AM   Ethan Green Jun 21, 1966 [id number]  Referring provider: Barbie Banner, MD [address],  [place] [address]  Followup nephrolithiasis   HPI: Ethan Green is a 56yo here for followup for nephrolithiasis. He underwent ESWL 2 weeks ago and passed numerous fragments. KUb today shows a multiple 1-42mm renal calculi.    PMH: Past Medical History:  Diagnosis Date   Anxiety    Borderline diabetes    Hypertension    Pre-diabetes     Surgical History: Past Surgical History:  Procedure Laterality Date   ANTERIOR CERVICAL DECOMP/DISCECTOMY FUSION N/A 10/29/2017   Procedure: REMOVAL C5-6 PLATE, L8-9 ANTERIOR CERVICAL DISCECTOMY AND FUSION, ALLOGRAFT, AND PLATE;  Surgeon: Eldred Manges, MD;  Location: [facility];  Service: Orthopedics;  Laterality: N/A;   CERVICAL SPINE SURGERY     2005   EXTRACORPOREAL SHOCK WAVE LITHOTRIPSY Right 08/13/2022   Procedure: EXTRACORPOREAL SHOCK WAVE LITHOTRIPSY (ESWL);  Surgeon: Malen Gauze, MD;  Location: [facility];  Service: Urology;  Laterality: Right;   HIATAL HERNIA REPAIR     1990   NASAL SINUS SURGERY     2002    Home Medications:  Allergies as of 08/28/2022       Reactions   Amoxicillin Itching   Has patient had a PCN reaction causing immediate rash, facial/tongue/throat swelling, SOB or lightheadedness with hypotension: Unknown Has patient had a PCN reaction causing severe rash involving mucus membranes or skin necrosis: Unknown Has patient had a PCN reaction that required hospitalization: No Has patient had a PCN reaction occurring within the last 10 years: No If all of the above answers are "NO", then may proceed with Cephalosporin use.   Pravastatin    UNSPECIFIED REACTION    Tamsulosin Other (See Comments)   Retrograde ejaculation.        Medication List        Accurate as of August 28, 2022  9:01 AM. If you have any questions, ask your nurse or doctor.          alfuzosin 10 MG 24 hr  tablet Commonly known as: Uroxatral Take 1 tablet (10 mg total) by mouth daily with breakfast.   ALPRAZolam 0.5 MG tablet Commonly known as: XANAX Take 0.5 mg by mouth 2 (two) times daily as needed.   cyclobenzaprine 10 MG tablet Commonly known as: FLEXERIL Take 1 tablet (10 mg total) by mouth at bedtime.   fluticasone 50 MCG/ACT nasal spray Commonly known as: FLONASE Place into both nostrils daily.   GOODY HEADACHE PO Take 2 packets by mouth daily.   hydrocortisone cream 0.5 % Apply 1 application topically 2 (two) times a week.   lisinopril 20 MG tablet Commonly known as: ZESTRIL TAKE 1 TABLET EVERY DAY FOR HYPERTENSION   metFORMIN 500 MG 24 hr tablet Commonly known as: GLUCOPHAGE-XR Take 1,000 mg by mouth 2 (two) times daily.   methocarbamol 500 MG tablet Commonly known as: ROBAXIN Take 1 tablet (500 mg total) by mouth every 6 (six) hours as needed for muscle spasms.   ondansetron 4 MG tablet Commonly known as: Zofran Take 1 tablet (4 mg total) by mouth daily as needed for nausea or vomiting.   oxyCODONE-acetaminophen 5-325 MG tablet Commonly known as: Percocet Take 1 tablet by mouth every 4 (four) hours as needed for severe pain.   PARoxetine 10 MG tablet Commonly known as: PAXIL TAKE 1 TABLET DAILY TO CONTROL ANXIETY   rosuvastatin 5 MG tablet Commonly  known as: CRESTOR   traMADol 50 MG tablet Commonly known as: ULTRAM Take 1 tablet (50 mg total) by mouth 2 (two) times daily as needed for moderate pain.        Allergies:  Allergies  Allergen Reactions   Amoxicillin Itching    Has patient had a PCN reaction causing immediate rash, facial/tongue/throat swelling, SOB or lightheadedness with hypotension: Unknown Has patient had a PCN reaction causing severe rash involving mucus membranes or skin necrosis: Unknown Has patient had a PCN reaction that required hospitalization: No Has patient had a PCN reaction occurring within the last 10 years: No If all  of the above answers are "NO", then may proceed with Cephalosporin use.    Pravastatin     UNSPECIFIED REACTION    Tamsulosin Other (See Comments)    Retrograde ejaculation.    Family History: No family history on file.  Social History:  reports that he has been smoking cigarettes. He has a 40.00 pack-year smoking history. He has never used smokeless tobacco. He reports current alcohol use. He reports current drug use. Drug: Marijuana.  ROS: All other review of systems were reviewed and are negative except what is noted above in HPI  Physical Exam: BP (!) 170/81   Pulse 76   Constitutional:  Alert and oriented, No acute distress. HEENT: [place] AT, moist mucus membranes.  Trachea midline, no masses. Cardiovascular: No clubbing, cyanosis, or edema. Respiratory: Normal respiratory effort, no increased work of breathing. GI: Abdomen is soft, nontender, nondistended, no abdominal masses GU: No CVA tenderness.  Lymph: No cervical or inguinal lymphadenopathy. Skin: No rashes, bruises or suspicious lesions. Neurologic: Grossly intact, no focal deficits, moving all 4 extremities. Psychiatric: Normal mood and affect.  Laboratory Data: Lab Results  Component Value Date   WBC 9.4 10/27/2017   HGB 16.4 10/27/2017   HCT 46.6 10/27/2017   MCV 91.0 10/27/2017   PLT 213 10/27/2017    Lab Results  Component Value Date   CREATININE 0.97 10/27/2017    No results found for: "PSA"  No results found for: "TESTOSTERONE"  Lab Results  Component Value Date   HGBA1C 7.1 (H) 10/27/2017    Urinalysis    Component Value Date/Time   COLORURINE YELLOW 10/27/2017 1540   APPEARANCEUR Cloudy (A) 08/07/2022 0956   LABSPEC 1.017 10/27/2017 1540   PHURINE 5.0 10/27/2017 1540   GLUCOSEU Trace (A) 08/07/2022 0956   HGBUR SMALL (A) 10/27/2017 1540   BILIRUBINUR Negative 08/07/2022 0956   KETONESUR NEGATIVE 10/27/2017 1540   PROTEINUR 1+ (A) 08/07/2022 0956   PROTEINUR NEGATIVE 10/27/2017 1540    NITRITE Negative 08/07/2022 0956   NITRITE NEGATIVE 10/27/2017 1540   LEUKOCYTESUR Negative 08/07/2022 0956    Lab Results  Component Value Date   LABMICR See below: 08/07/2022   WBCUA 0-5 08/07/2022   LABEPIT 0-10 08/07/2022   MUCUS Present (A) 08/07/2022   BACTERIA None seen 08/07/2022    Pertinent Imaging: KUb today: Images reviewed and discussed with the patient  Results for orders placed during the hospital encounter of 08/13/22  DG Abd 1 View  Narrative CLINICAL DATA:  Nephrolithiasis, pre lithotripsy RIGHT  EXAM: ABDOMEN - 1 VIEW  COMPARISON:  11/12/2019  FINDINGS: 2 calculi project over RIGHT kidney, 12 x 6 mm and 10 x 8 mm.  No additional urinary tract calcifications.  Scattered degenerative disc disease changes lumbar spine.  No acute osseous findings.  Bowel gas pattern normal.  IMPRESSION: 2 RIGHT renal calculi, 12 x 6  mm and 10 x 8 mm.   Electronically Signed By: Ulyses Southward M.D. On: 08/13/2022 16:09  No results found for this or any previous visit.  No results found for this or any previous visit.  No results found for this or any previous visit.  No results found for this or any previous visit.  No valid procedures specified. Results for orders placed during the hospital encounter of 08/03/22  CT HEMATURIA WORKUP  Narrative CLINICAL DATA:  Hematuria.  Nephrolithiasis.  EXAM: CT ABDOMEN AND PELVIS WITHOUT AND WITH CONTRAST  TECHNIQUE: Multidetector CT imaging of the abdomen and pelvis was performed following the standard protocol before and following the bolus administration of intravenous contrast.  RADIATION DOSE REDUCTION: This exam was performed according to the departmental dose-optimization program which includes automated exposure control, adjustment of the mA and/or kV according to patient size and/or use of iterative reconstruction technique.  CONTRAST:  OMNIPAQUE IOHEXOL 300 MG/ML  SOLN  COMPARISON:   02/23/2019  FINDINGS: Lower Chest: 7 mm pulmonary nodule again seen in lateral left lower lobe, consistent with benign etiology.  Hepatobiliary: No hepatic masses identified. Tiny sub-cm cyst noted in liver dome. Mild diffuse hepatic steatosis again seen. Gallbladder is unremarkable. No evidence of biliary ductal dilatation.  Pancreas:  No mass or inflammatory changes.  Spleen: Within normal limits in size and appearance.  Adrenals/Urinary Tract: No adrenal masses identified. Few renal calculi are again seen bilaterally, largest in the right kidney measuring 9 mm. No evidence of ureteral calculi or hydronephrosis. Tiny benign subcapsular left renal cyst again seen (no followup imaging is recommended). No suspicious renal masses identified. No masses seen involving the collecting systems, ureters, or bladder.  Stomach/Bowel: Prior Nissen fundoplication again noted. No evidence of obstruction, inflammatory process or abnormal fluid collections. Normal appendix visualized. Diverticulosis is seen mainly involving the sigmoid colon, however there is no evidence of diverticulitis.  Vascular/Lymphatic: No pathologically enlarged lymph nodes. No acute vascular findings. Aortic atherosclerotic calcification incidentally noted.  Reproductive:  No mass or other significant abnormality.  Other:  Stable small right inguinal hernia, which contains only fat.  Musculoskeletal:  No suspicious bone lesions identified.  IMPRESSION: Bilateral nephrolithiasis. No evidence of ureteral calculi, hydronephrosis, or other acute findings.  No radiographic evidence of urinary tract neoplasm.  Mild hepatic steatosis.  Colonic diverticulosis, without radiographic evidence of diverticulitis.  Stable small right inguinal hernia, which contains only fat.  Aortic Atherosclerosis (ICD10-I70.0).   Electronically Signed By: Danae Orleans M.D. On: 08/04/2022 17:15  No results found for this or any  previous visit.   Assessment & Plan:    1. Nephrolithiasis RTC 1 month with KUB  - Urinalysis, Routine w reflex microscopic   No follow-ups on file.  Wilkie Aye, MD  [facility] [place]

## 2022-08-28 NOTE — Patient Instructions (Signed)

## 2022-08-28 NOTE — Addendum Note (Signed)
Addended by: Gustavus Messing on: 08/28/2022 09:44 AM   Modules accepted: Orders

## 2022-08-29 LAB — URINALYSIS, ROUTINE W REFLEX MICROSCOPIC
Bilirubin, UA: NEGATIVE
Ketones, UA: NEGATIVE
Leukocytes,UA: NEGATIVE
Nitrite, UA: NEGATIVE
Protein,UA: NEGATIVE
Specific Gravity, UA: 1.015 (ref 1.005–1.030)
Urobilinogen, Ur: 0.2 mg/dL (ref 0.2–1.0)
pH, UA: 6 (ref 5.0–7.5)

## 2022-08-29 LAB — MICROSCOPIC EXAMINATION: Bacteria, UA: NONE SEEN

## 2022-09-04 ENCOUNTER — Other Ambulatory Visit: Payer: Self-pay | Admitting: Urology

## 2022-09-06 LAB — CALCULI, WITH PHOTOGRAPH (CLINICAL LAB)
Calcium Oxalate Dihydrate: 10 %
Calcium Oxalate Monohydrate: 90 %
Weight Calculi: 65 mg

## 2022-09-27 ENCOUNTER — Ambulatory Visit: Payer: BLUE CROSS/BLUE SHIELD | Admitting: Urology

## 2023-03-19 ENCOUNTER — Ambulatory Visit: Payer: BLUE CROSS/BLUE SHIELD | Admitting: Urology

## 2023-03-23 ENCOUNTER — Other Ambulatory Visit: Payer: Self-pay

## 2023-03-23 DIAGNOSIS — N200 Calculus of kidney: Secondary | ICD-10-CM

## 2023-03-24 ENCOUNTER — Ambulatory Visit: Payer: BLUE CROSS/BLUE SHIELD | Admitting: Urology

## 2023-04-23 ENCOUNTER — Ambulatory Visit (HOSPITAL_COMMUNITY)
Admission: RE | Admit: 2023-04-23 | Discharge: 2023-04-23 | Disposition: A | Discharge status: Home / Self Care | Payer: BC Managed Care – PPO | Source: Ambulatory Visit | Attending: Urology | Admitting: Urology

## 2023-04-23 ENCOUNTER — Ambulatory Visit: Payer: BLUE CROSS/BLUE SHIELD | Admitting: Urology

## 2023-04-23 VITALS — BP 146/68 | HR 67

## 2023-04-23 DIAGNOSIS — R351 Nocturia: Secondary | ICD-10-CM

## 2023-04-23 DIAGNOSIS — N200 Calculus of kidney: Secondary | ICD-10-CM

## 2023-04-23 DIAGNOSIS — N401 Enlarged prostate with lower urinary tract symptoms: Secondary | ICD-10-CM | POA: Diagnosis not present

## 2023-04-23 DIAGNOSIS — N138 Other obstructive and reflux uropathy: Secondary | ICD-10-CM | POA: Diagnosis not present

## 2023-04-23 LAB — URINALYSIS, ROUTINE W REFLEX MICROSCOPIC
Bilirubin, UA: NEGATIVE
Glucose, UA: NEGATIVE
Ketones, UA: NEGATIVE
Leukocytes,UA: NEGATIVE
Nitrite, UA: NEGATIVE
Protein,UA: NEGATIVE
RBC, UA: NEGATIVE
Specific Gravity, UA: 1.025 (ref 1.005–1.030)
Urobilinogen, Ur: 1 mg/dL (ref 0.2–1.0)
pH, UA: 6 (ref 5.0–7.5)

## 2023-04-23 MED ORDER — ALFUZOSIN HCL ER 10 MG PO TB24: 10.0000 mg | ORAL_TABLET | Freq: Every day | ORAL | 11 refills | Status: DC

## 2023-04-23 NOTE — Progress Notes (Signed)
04/23/2023 10:19 AM   Ethan Green April 20, 1966 [id number]  Referring provider: Barbie Banner, MD [address] [place],  [place] [address]  Followup nephrolithiasis   HPI: Mr Ethan Green is a 57yo here for followup for nephrolithaisis and BPH. No stone events since last visit. No flank pain. KUB from today shows a 3mm right renal calculus. IPSS 17 QOl 4 on finasteride. He started finasteride 5mg . Urine stream. Nocturia 2-3x.    PMH: Past Medical History:  Diagnosis Date   Anxiety    Borderline diabetes    Hypertension    Pre-diabetes     Surgical History: Past Surgical History:  Procedure Laterality Date   ANTERIOR CERVICAL DECOMP/DISCECTOMY FUSION N/A 10/29/2017   Procedure: REMOVAL C5-6 PLATE, V7-8 ANTERIOR CERVICAL DISCECTOMY AND FUSION, ALLOGRAFT, AND PLATE;  Surgeon: Eldred Manges, MD;  Location: [facility];  Service: Orthopedics;  Laterality: N/A;   CERVICAL SPINE SURGERY     2005   EXTRACORPOREAL SHOCK WAVE LITHOTRIPSY Right 08/13/2022   Procedure: EXTRACORPOREAL SHOCK WAVE LITHOTRIPSY (ESWL);  Surgeon: Malen Gauze, MD;  Location: [facility];  Service: Urology;  Laterality: Right;   HIATAL HERNIA REPAIR     1990   NASAL SINUS SURGERY     2002    Home Medications:  Allergies as of 04/23/2023       Reactions   Amoxicillin Itching   Has patient had a PCN reaction causing immediate rash, facial/tongue/throat swelling, SOB or lightheadedness with hypotension: Unknown Has patient had a PCN reaction causing severe rash involving mucus membranes or skin necrosis: Unknown Has patient had a PCN reaction that required hospitalization: No Has patient had a PCN reaction occurring within the last 10 years: No If all of the above answers are "NO", then may proceed with Cephalosporin use.   Pravastatin    UNSPECIFIED REACTION    Tamsulosin Other (See Comments)   Retrograde ejaculation.        Medication List        Accurate as of April 23, 2023 10:19 AM. If you have  any questions, ask your nurse or doctor.          alfuzosin 10 MG 24 hr tablet Commonly known as: UROXATRAL TAKE 1 TABLET (10 MG TOTAL) BY MOUTH DAILY WITH BREAKFAST.   ALPRAZolam 0.5 MG tablet Commonly known as: XANAX Take 0.5 mg by mouth 2 (two) times daily as needed.   cyclobenzaprine 10 MG tablet Commonly known as: FLEXERIL Take 1 tablet (10 mg total) by mouth at bedtime.   fluticasone 50 MCG/ACT nasal spray Commonly known as: FLONASE Place into both nostrils daily.   GOODY HEADACHE PO Take 2 packets by mouth daily.   hydrocortisone cream 0.5 % Apply 1 application topically 2 (two) times a week.   lisinopril 20 MG tablet Commonly known as: ZESTRIL TAKE 1 TABLET EVERY DAY FOR HYPERTENSION   metFORMIN 500 MG 24 hr tablet Commonly known as: GLUCOPHAGE-XR Take 1,000 mg by mouth 2 (two) times daily.   methocarbamol 500 MG tablet Commonly known as: ROBAXIN Take 1 tablet (500 mg total) by mouth every 6 (six) hours as needed for muscle spasms.   ondansetron 4 MG tablet Commonly known as: Zofran Take 1 tablet (4 mg total) by mouth daily as needed for nausea or vomiting.   oxyCODONE-acetaminophen 5-325 MG tablet Commonly known as: Percocet Take 1 tablet by mouth every 4 (four) hours as needed for severe pain.   PARoxetine 10 MG tablet Commonly known as:  PAXIL TAKE 1 TABLET DAILY TO CONTROL ANXIETY   rosuvastatin 5 MG tablet Commonly known as: CRESTOR   traMADol 50 MG tablet Commonly known as: ULTRAM Take 1 tablet (50 mg total) by mouth 2 (two) times daily as needed for moderate pain.        Allergies:  Allergies  Allergen Reactions   Amoxicillin Itching    Has patient had a PCN reaction causing immediate rash, facial/tongue/throat swelling, SOB or lightheadedness with hypotension: Unknown Has patient had a PCN reaction causing severe rash involving mucus membranes or skin necrosis: Unknown Has patient had a PCN reaction that required hospitalization:  No Has patient had a PCN reaction occurring within the last 10 years: No If all of the above answers are "NO", then may proceed with Cephalosporin use.    Pravastatin     UNSPECIFIED REACTION    Tamsulosin Other (See Comments)    Retrograde ejaculation.    Family History: No family history on file.  Social History:  reports that he has been smoking cigarettes. He has a 40.00 pack-year smoking history. He has never used smokeless tobacco. He reports current alcohol use. He reports current drug use. Drug: Marijuana.  ROS: All other review of systems were reviewed and are negative except what is noted above in HPI  Physical Exam: BP (!) 146/68   Pulse 67   Constitutional:  Alert and oriented, No acute distress. HEENT: [place] AT, moist mucus membranes.  Trachea midline, no masses. Cardiovascular: No clubbing, cyanosis, or edema. Respiratory: Normal respiratory effort, no increased work of breathing. GI: Abdomen is soft, nontender, nondistended, no abdominal masses GU: No CVA tenderness.  Lymph: No cervical or inguinal lymphadenopathy. Skin: No rashes, bruises or suspicious lesions. Neurologic: Grossly intact, no focal deficits, moving all 4 extremities. Psychiatric: Normal mood and affect.  Laboratory Data: Lab Results  Component Value Date   WBC 9.4 10/27/2017   HGB 16.4 10/27/2017   HCT 46.6 10/27/2017   MCV 91.0 10/27/2017   PLT 213 10/27/2017    Lab Results  Component Value Date   CREATININE 0.97 10/27/2017    No results found for: "PSA"  No results found for: "TESTOSTERONE"  Lab Results  Component Value Date   HGBA1C 7.1 (H) 10/27/2017    Urinalysis    Component Value Date/Time   COLORURINE YELLOW 10/27/2017 1540   APPEARANCEUR Clear 08/28/2022 0911   LABSPEC 1.017 10/27/2017 1540   PHURINE 5.0 10/27/2017 1540   GLUCOSEU 1+ (A) 08/28/2022 0911   HGBUR SMALL (A) 10/27/2017 1540   BILIRUBINUR Negative 08/28/2022 0911   KETONESUR NEGATIVE 10/27/2017 1540    PROTEINUR Negative 08/28/2022 0911   PROTEINUR NEGATIVE 10/27/2017 1540   NITRITE Negative 08/28/2022 0911   NITRITE NEGATIVE 10/27/2017 1540   LEUKOCYTESUR Negative 08/28/2022 0911    Lab Results  Component Value Date   LABMICR See below: 08/28/2022   WBCUA 0-5 08/28/2022   LABEPIT 0-10 08/28/2022   MUCUS Present (A) 08/07/2022   BACTERIA None seen 08/28/2022    Pertinent Imaging: KUb today: Images reviewed and discussed with the patient  Results for orders placed in visit on 08/26/22  DG Abd 1 View  Narrative CLINICAL DATA:  Follow-up kidney stones. Post lithotripsy.  EXAM: ABDOMEN - 1 VIEW  COMPARISON:  Radiograph 08/13/2022, CT 08/03/2022  FINDINGS: The previous right intrarenal calculi hip diminished in size and density. Stone fragments projecting over the lower pole of the right kidney span 12 mm. Stone fragments projecting over the mid kidney span  5 mm. Calcifications just to the right of the sacrum are presumably vascular calcifications rather than ureteral stones. No definite stones in the bladder. Normal bowel gas pattern.  IMPRESSION: Previous right intrarenal calculi diminished in size and density. Stone fragments projecting over the mid and lower right kidney. Calcifications just to the right of the sacrum are presumably vascular calcifications rather than ureteral stones.   Electronically Signed By: Narda Rutherford M.D. On: 08/29/2022 12:43  No results found for this or any previous visit.  No results found for this or any previous visit.  No results found for this or any previous visit.  No results found for this or any previous visit.  No valid procedures specified. Results for orders placed during the hospital encounter of 08/03/22  CT HEMATURIA WORKUP  Narrative CLINICAL DATA:  Hematuria.  Nephrolithiasis.  EXAM: CT ABDOMEN AND PELVIS WITHOUT AND WITH CONTRAST  TECHNIQUE: Multidetector CT imaging of the abdomen and pelvis was  performed following the standard protocol before and following the bolus administration of intravenous contrast.  RADIATION DOSE REDUCTION: This exam was performed according to the departmental dose-optimization program which includes automated exposure control, adjustment of the mA and/or kV according to patient size and/or use of iterative reconstruction technique.  CONTRAST:  OMNIPAQUE IOHEXOL 300 MG/ML  SOLN  COMPARISON:  02/23/2019  FINDINGS: Lower Chest: 7 mm pulmonary nodule again seen in lateral left lower lobe, consistent with benign etiology.  Hepatobiliary: No hepatic masses identified. Tiny sub-cm cyst noted in liver dome. Mild diffuse hepatic steatosis again seen. Gallbladder is unremarkable. No evidence of biliary ductal dilatation.  Pancreas:  No mass or inflammatory changes.  Spleen: Within normal limits in size and appearance.  Adrenals/Urinary Tract: No adrenal masses identified. Few renal calculi are again seen bilaterally, largest in the right kidney measuring 9 mm. No evidence of ureteral calculi or hydronephrosis. Tiny benign subcapsular left renal cyst again seen (no followup imaging is recommended). No suspicious renal masses identified. No masses seen involving the collecting systems, ureters, or bladder.  Stomach/Bowel: Prior Nissen fundoplication again noted. No evidence of obstruction, inflammatory process or abnormal fluid collections. Normal appendix visualized. Diverticulosis is seen mainly involving the sigmoid colon, however there is no evidence of diverticulitis.  Vascular/Lymphatic: No pathologically enlarged lymph nodes. No acute vascular findings. Aortic atherosclerotic calcification incidentally noted.  Reproductive:  No mass or other significant abnormality.  Other:  Stable small right inguinal hernia, which contains only fat.  Musculoskeletal:  No suspicious bone lesions identified.  IMPRESSION: Bilateral nephrolithiasis.  No evidence of ureteral calculi, hydronephrosis, or other acute findings.  No radiographic evidence of urinary tract neoplasm.  Mild hepatic steatosis.  Colonic diverticulosis, without radiographic evidence of diverticulitis.  Stable small right inguinal hernia, which contains only fat.  Aortic Atherosclerosis (ICD10-I70.0).   Electronically Signed By: Danae Orleans M.D. On: 08/04/2022 17:15  No results found for this or any previous visit.   Assessment & Plan:    1. Nephrolithiasis -Followup 6 months with KUB - Urinalysis, Routine w reflex microscopic  2. BPh with nocturia -restart uroxatral 10mg     No follow-ups on file.  Wilkie Aye, MD  [facility] [place]

## 2023-04-29 ENCOUNTER — Encounter: Payer: Self-pay | Admitting: Urology

## 2023-04-29 NOTE — Patient Instructions (Signed)

## 2024-06-21 ENCOUNTER — Other Ambulatory Visit: Payer: Self-pay | Admitting: Urology
